# Patient Record
Sex: Female | Born: 1990 | Race: Black or African American | Hispanic: No | Marital: Single | Smoking: Light tobacco smoker
Health system: Southern US, Community
[De-identification: ages and names within clinical notes are randomized; demographics above are authoritative.]

## PROBLEM LIST (undated history)

## (undated) DIAGNOSIS — Z8639 Personal history of other endocrine, nutritional and metabolic disease: Secondary | ICD-10-CM

## (undated) DIAGNOSIS — E538 Deficiency of other specified B group vitamins: Secondary | ICD-10-CM

## (undated) DIAGNOSIS — H0520 Unspecified exophthalmos: Secondary | ICD-10-CM

## (undated) DIAGNOSIS — I10 Essential (primary) hypertension: Secondary | ICD-10-CM

## (undated) DIAGNOSIS — E049 Nontoxic goiter, unspecified: Secondary | ICD-10-CM

## (undated) DIAGNOSIS — E0500 Thyrotoxicosis with diffuse goiter without thyrotoxic crisis or storm: Secondary | ICD-10-CM

## (undated) DIAGNOSIS — E0590 Thyrotoxicosis, unspecified without thyrotoxic crisis or storm: Secondary | ICD-10-CM

## (undated) DIAGNOSIS — R569 Unspecified convulsions: Secondary | ICD-10-CM

## (undated) DIAGNOSIS — R7989 Other specified abnormal findings of blood chemistry: Secondary | ICD-10-CM

## (undated) HISTORY — DX: Other specified abnormal findings of blood chemistry: R79.89

## (undated) HISTORY — PX: THYROIDECTOMY: SHX17

## (undated) HISTORY — DX: Deficiency of other specified B group vitamins: E53.8

---

## 2014-11-05 DIAGNOSIS — E0590 Thyrotoxicosis, unspecified without thyrotoxic crisis or storm: Secondary | ICD-10-CM

## 2014-11-05 HISTORY — DX: Thyrotoxicosis, unspecified without thyrotoxic crisis or storm: E05.90

## 2015-06-24 ENCOUNTER — Encounter (HOSPITAL_BASED_OUTPATIENT_CLINIC_OR_DEPARTMENT_OTHER): Payer: Self-pay | Admitting: *Deleted

## 2015-06-24 ENCOUNTER — Emergency Department (HOSPITAL_BASED_OUTPATIENT_CLINIC_OR_DEPARTMENT_OTHER)
Admission: EM | Admit: 2015-06-24 | Discharge: 2015-06-24 | Disposition: A | Discharge status: Home / Self Care | Payer: Self-pay | Attending: Emergency Medicine | Admitting: Emergency Medicine

## 2015-06-24 ENCOUNTER — Other Ambulatory Visit: Payer: Self-pay

## 2015-06-24 DIAGNOSIS — E0590 Thyrotoxicosis, unspecified without thyrotoxic crisis or storm: Secondary | ICD-10-CM | POA: Insufficient documentation

## 2015-06-24 DIAGNOSIS — R002 Palpitations: Secondary | ICD-10-CM | POA: Insufficient documentation

## 2015-06-24 DIAGNOSIS — Z720 Tobacco use: Secondary | ICD-10-CM | POA: Insufficient documentation

## 2015-06-24 DIAGNOSIS — R197 Diarrhea, unspecified: Secondary | ICD-10-CM | POA: Insufficient documentation

## 2015-06-24 DIAGNOSIS — R000 Tachycardia, unspecified: Secondary | ICD-10-CM | POA: Insufficient documentation

## 2015-06-24 DIAGNOSIS — Z3202 Encounter for pregnancy test, result negative: Secondary | ICD-10-CM | POA: Insufficient documentation

## 2015-06-24 LAB — CBC WITH DIFFERENTIAL/PLATELET
BASOS PCT: 0 % (ref 0–1)
Basophils Absolute: 0 10*3/uL (ref 0.0–0.1)
EOS ABS: 0 10*3/uL (ref 0.0–0.7)
Eosinophils Relative: 0 % (ref 0–5)
HEMATOCRIT: 33.4 % — AB (ref 36.0–46.0)
HEMOGLOBIN: 11.4 g/dL — AB (ref 12.0–15.0)
Lymphocytes Relative: 39 % (ref 12–46)
Lymphs Abs: 1.8 10*3/uL (ref 0.7–4.0)
MCH: 28.1 pg (ref 26.0–34.0)
MCHC: 34.1 g/dL (ref 30.0–36.0)
MCV: 82.3 fL (ref 78.0–100.0)
Monocytes Absolute: 0.5 10*3/uL (ref 0.1–1.0)
Monocytes Relative: 11 % (ref 3–12)
NEUTROS ABS: 2.3 10*3/uL (ref 1.7–7.7)
NEUTROS PCT: 50 % (ref 43–77)
Platelets: 138 10*3/uL — ABNORMAL LOW (ref 150–400)
RBC: 4.06 MIL/uL (ref 3.87–5.11)
RDW: 13.5 % (ref 11.5–15.5)
WBC: 4.6 10*3/uL (ref 4.0–10.5)

## 2015-06-24 LAB — BASIC METABOLIC PANEL
ANION GAP: 7 (ref 5–15)
BUN: 10 mg/dL (ref 6–20)
CALCIUM: 9.1 mg/dL (ref 8.9–10.3)
CHLORIDE: 106 mmol/L (ref 101–111)
CO2: 24 mmol/L (ref 22–32)
CREATININE: 0.37 mg/dL — AB (ref 0.44–1.00)
GFR calc non Af Amer: >60 mL/min (ref >60)
Glucose, Bld: 164 mg/dL — ABNORMAL HIGH (ref 65–99)
Potassium: 3.3 mmol/L — ABNORMAL LOW (ref 3.5–5.1)
SODIUM: 137 mmol/L (ref 135–145)

## 2015-06-24 LAB — TSH: TSH: 0.018 u[IU]/mL — ABNORMAL LOW (ref 0.350–4.500)

## 2015-06-24 LAB — HCG, SERUM, QUALITATIVE: PREG SERUM: NEGATIVE

## 2015-06-24 MED ORDER — METHIMAZOLE 10 MG PO TABS: 10.0000 mg | ORAL_TABLET | Freq: Every day | ORAL | Status: DC

## 2015-06-24 MED ORDER — METOPROLOL TARTRATE 50 MG PO TABS
25.0000 mg | ORAL_TABLET | Freq: Once | ORAL | Status: AC
  Administered 2015-06-24: 25 mg via ORAL
  Filled 2015-06-24: qty 1

## 2015-06-24 MED ORDER — SODIUM CHLORIDE 0.9 % IV BOLUS (SEPSIS)
1000.0000 mL | Freq: Once | INTRAVENOUS | Status: AC
  Administered 2015-06-24: 1000 mL via INTRAVENOUS

## 2015-06-24 MED ORDER — METOPROLOL TARTRATE 1 MG/ML IV SOLN
5.0000 mg | INTRAVENOUS | Status: DC | PRN
  Administered 2015-06-24: 5 mg via INTRAVENOUS
  Filled 2015-06-24: qty 5

## 2015-06-24 MED ORDER — METHIMAZOLE 10 MG PO TABS
10.0000 mg | ORAL_TABLET | Freq: Once | ORAL | Status: DC
  Filled 2015-06-24: qty 1

## 2015-06-24 MED ORDER — PROPRANOLOL HCL 10 MG PO TABS: 10.0000 mg | ORAL_TABLET | Freq: Three times a day (TID) | ORAL | Status: DC

## 2015-06-24 NOTE — ED Notes (Signed)
Handoff to [place], Charity fundraiser

## 2015-06-24 NOTE — Discharge Instructions (Signed)
You will receive a call from the care manager at [facility] hospital to help you with your follow-up appointment at community clinic at [facility]. Begin your medications today. No aspirin, or aspirin products.   Hyperthyroidism The thyroid is a large gland located in the lower front part of your neck. The thyroid helps control metabolism. Metabolism is how your body uses food. It controls metabolism with the hormone thyroxine. When the thyroid is overactive, it produces too much hormone. When this happens, these following problems may occur:   Nervousness  Heat intolerance  Weight loss (in spite of increase food intake)  Diarrhea  Change in hair or skin texture  Palpitations (heart skipping or having extra beats)  Tachycardia (rapid heart rate)  Loss of menstruation (amenorrhea)  Shaking of the hands CAUSES  Grave's Disease (the immune system attacks the thyroid gland). This is the most common cause.  Inflammation of the thyroid gland.  Tumor (usually benign) in the thyroid gland or elsewhere.  Excessive use of thyroid medications (both prescription and 'natural').  Excessive ingestion of Iodine. DIAGNOSIS  To prove hyperthyroidism, your caregiver may do blood tests and ultrasound tests. Sometimes the signs are hidden. It may be necessary for your caregiver to watch this illness with blood tests, either before or after diagnosis and treatment. TREATMENT Short-term treatment There are several treatments to control symptoms. Drugs called beta blockers may give some relief. Drugs that decrease hormone production will provide temporary relief in many people. These measures will usually not give permanent relief. Definitive therapy There are treatments available which can be discussed between you and your caregiver which will permanently treat the problem. These treatments range from surgery (removal of the thyroid), to the use of radioactive iodine (destroys the thyroid by  radiation), to the use of antithyroid drugs (interfere with hormone synthesis). The first two treatments are permanent and usually successful. They most often require hormone replacement therapy for life. This is because it is impossible to remove or destroy the exact amount of thyroid required to make a person euthyroid (normal). HOME CARE INSTRUCTIONS  See your caregiver if the problems you are being treated for get worse. Examples of this would be the problems listed above. SEEK MEDICAL CARE IF: Your general condition worsens. MAKE SURE YOU:   Understand these instructions.  Will watch your condition.  Will get help right away if you are not doing well or get worse. Document Released: 10/22/2005 Document Revised: 01/14/2012 Document Reviewed: 03/05/2007 [facility] Patient Information 2015 [place], [place]. This information is not intended to replace advice given to you by your health care provider. Make sure you discuss any questions you have with your health care provider.

## 2015-06-24 NOTE — ED Notes (Signed)
Patient give water per her requesting and Dr. Fayrene Fearing approval.

## 2015-06-24 NOTE — Discharge Planning (Signed)
NCM consulted to assist with getting uninsured pt an appointment.  NCM called [facility] to find there we no appointments at this time and was transferred to Clinic Manager, Tarry Kos to request opening a slot for this pt.  VM left.  NCM contacted [facility] [place], Nurse Liason to try to get follow-up appointment or appointment at [facility] also with no luck.   NCM will continue to look for follow-up care for this pt.

## 2015-06-24 NOTE — ED Notes (Signed)
Pt reports that the health dept told her that she had thyroid problems.  Pt wants to be rx a medication to help with it.  Reports weight loss of >50lbs in 2 months.  Pt appears healthy.

## 2015-06-24 NOTE — ED Provider Notes (Addendum)
CSN: [id number]     Arrival date & time 06/24/15  1016 History   First MD Initiated Contact with Patient 06/24/15 1026     Chief Complaint  Patient presents with  . Thyroid Problem      HPI  Patient resists evaluation of weight loss, neck swelling, and concern about "my thyroid".   Patient states that she was seen and evaluated at the health department. Told states she told them that she had been losing some weight , and that her neck seemed  swollen. She was told that she "may have an overactive thyroid".  As was a month ago. In the interval she has continued to have palpitations. Tremor. Daily diarrhea. 30-50 pound weight loss. Her mother has noted that her eyes seemed bulge in her neck seems "swollen".   History reviewed. No pertinent past surgical history. History reviewed. No pertinent family history. Social History  Substance Use Topics  . Smoking status: Current Every Day Smoker -- 1.00 packs/day    Types: Cigarettes  . Smokeless tobacco: None  . Alcohol Use: No   OB History    No data available     Review of Systems  Constitutional: Positive for unexpected weight change. Negative for fever, chills, diaphoresis, appetite change and fatigue.  HENT: Negative for mouth sores, sore throat and trouble swallowing.        Neck "Swelling"  Eyes: Negative for visual disturbance.  Respiratory: Negative for cough, chest tightness, shortness of breath and wheezing.   Cardiovascular: Positive for palpitations. Negative for chest pain.  Gastrointestinal: Positive for diarrhea. Negative for nausea, vomiting, abdominal pain and abdominal distention.  Endocrine: Negative for polydipsia, polyphagia and polyuria.  Genitourinary: Negative for dysuria, frequency and hematuria.  Musculoskeletal: Negative for gait problem.  Skin: Negative for color change, pallor and rash.  Neurological: Negative for dizziness, syncope, light-headedness and headaches.  Hematological: Does not bruise/bleed  easily.  Psychiatric/Behavioral: Negative for behavioral problems and confusion.      Allergies  Review of patient's allergies indicates no known allergies.  Home Medications   Prior to Admission medications   Medication Sig Start Date End Date Taking? Authorizing Provider  methimazole (TAPAZOLE) 10 MG tablet Take 1 tablet (10 mg total) by mouth daily. 06/24/15   Rolland Porter, MD  propranolol (INDERAL) 10 MG tablet Take 1 tablet (10 mg total) by mouth 3 (three) times daily. 06/24/15   Rolland Porter, MD   BP 149/81 mmHg  Pulse 101  Temp(Src) 98.8 F (37.1 C) (Oral)  Resp 18  Ht 5\' 5"  (1.651 m)  Wt 165 lb (74.844 kg)  BMI 27.46 kg/m2  SpO2 99%  LMP 06/23/2015 Physical Exam  Constitutional: She is oriented to person, place, and time. She appears well-developed and well-nourished. No distress.  HENT:  Head: Normocephalic.  Eyes: Conjunctivae are normal. Pupils are equal, round, and reactive to light. No scleral icterus.  Neck: Normal range of motion. Neck supple. Thyromegaly present.    Cardiovascular: Regular rhythm.  Tachycardia present.  Exam reveals no gallop and no friction rub.   No murmur heard. Resting sinus tachycardia rate 1:30.  Pulmonary/Chest: Effort normal and breath sounds normal. No respiratory distress. She has no wheezes. She has no rales.  Abdominal: Soft. Bowel sounds are normal. She exhibits no distension. There is no tenderness. There is no rebound.  Musculoskeletal: Normal range of motion.  Neurological: She is alert and oriented to person, place, and time.  Skin: Skin is warm and dry. No rash noted.  Psychiatric: She has a normal mood and affect. Her behavior is normal.    ED Course  Procedures (including critical care time) Labs Review Labs Reviewed  TSH - Abnormal; Notable for the following:    TSH 0.018 (*)    All other components within normal limits  BASIC METABOLIC PANEL - Abnormal; Notable for the following:    Potassium 3.3 (*)    Glucose,  Bld 164 (*)    Creatinine, Ser 0.37 (*)    All other components within normal limits  CBC WITH DIFFERENTIAL/PLATELET - Abnormal; Notable for the following:    Hemoglobin 11.4 (*)    HCT 33.4 (*)    Platelets 138 (*)    All other components within normal limits  HCG, SERUM, QUALITATIVE  T4  T3    Imaging Review No results found. I have personally reviewed and evaluated these images and lab results as part of my medical decision-making.   EKG Interpretation None      MDM   Final diagnoses:  Hyperthyroidism   Classic symptoms and presentation for thyrotoxicosis. Is not hypotensive or confused or febrile. Not clinically in thyroid storm. Was given beta blockers with IV metoprolol and fluids. Heart rate improves to 99.  TSH sent to [facility] laboratory and is expectantly low at 0.018. I placed a call to to endocrinologist. We do not keep an endocrinologist on call. Both of these physicians were out of the office on this Friday afternoon and unavailable.  I spoke with our care manager. Patient is uninsured and does not have a current primary care physician. Arrangements made for her to go on the [facility] card system to [facility] on Monday. She will call for an appointment she has not heard back from the care manager regarding a specific time.  I spoke with our pharmacist here at [facility] and we discussed the most inexpensive beta blockers and dosing and cost for methimazole. Given a prescription for Inderal and methimazole. I think this is a very safe initiation of treatment for her. I feel this is necessary to call to her progression of symptoms. We discussed that she will thoroughly need ultrasound and ultimate definitive treatment including possible I-131 treatment versus surgical treatment. She Jolyn Nap understanding this. This can be addressed further follow-up appointment.  CRITICAL CARE Performed by: Rolland Porter JOSEPH   Total critical care time: 60  Minutes.  IV fluid bolus, IV beta blockers for heart rate control. Multiple consultations. Multiple re-evaluations and discussions with patient and family.  Critical care time was exclusive of separately billable procedures and treating other patients.  Critical care was necessary to treat or prevent imminent or life-threatening deterioration.  Critical care was time spent personally by me on the following activities: development of treatment plan with patient and/or surrogate as well as nursing, discussions with consultants, evaluation of patient's response to treatment, examination of patient, obtaining history from patient or surrogate, ordering and performing treatments and interventions, ordering and review of laboratory studies, ordering and review of radiographic studies, pulse oximetry and re-evaluation of patient's condition. care    Rolland Porter, MD 06/24/15 1631  Rolland Porter, MD 06/24/15 [phone]

## 2015-06-27 LAB — T3

## 2015-07-01 LAB — T4

## 2015-09-08 ENCOUNTER — Emergency Department (HOSPITAL_BASED_OUTPATIENT_CLINIC_OR_DEPARTMENT_OTHER): Payer: Self-pay

## 2015-09-08 ENCOUNTER — Encounter (HOSPITAL_BASED_OUTPATIENT_CLINIC_OR_DEPARTMENT_OTHER): Payer: Self-pay | Admitting: Emergency Medicine

## 2015-09-08 ENCOUNTER — Emergency Department (HOSPITAL_BASED_OUTPATIENT_CLINIC_OR_DEPARTMENT_OTHER)
Admission: EM | Admit: 2015-09-08 | Discharge: 2015-09-08 | Disposition: A | Discharge status: Home / Self Care | Payer: Self-pay | Attending: Emergency Medicine | Admitting: Emergency Medicine

## 2015-09-08 DIAGNOSIS — Z79899 Other long term (current) drug therapy: Secondary | ICD-10-CM | POA: Insufficient documentation

## 2015-09-08 DIAGNOSIS — Z720 Tobacco use: Secondary | ICD-10-CM | POA: Insufficient documentation

## 2015-09-08 DIAGNOSIS — E010 Iodine-deficiency related diffuse (endemic) goiter: Secondary | ICD-10-CM | POA: Insufficient documentation

## 2015-09-08 DIAGNOSIS — J029 Acute pharyngitis, unspecified: Secondary | ICD-10-CM | POA: Insufficient documentation

## 2015-09-08 DIAGNOSIS — E0590 Thyrotoxicosis, unspecified without thyrotoxic crisis or storm: Secondary | ICD-10-CM | POA: Insufficient documentation

## 2015-09-08 HISTORY — DX: Thyrotoxicosis, unspecified without thyrotoxic crisis or storm: E05.90

## 2015-09-08 LAB — HCG, SERUM, QUALITATIVE: Preg, Serum: NEGATIVE

## 2015-09-08 LAB — BASIC METABOLIC PANEL
ANION GAP: 8 (ref 5–15)
BUN: 11 mg/dL (ref 6–20)
CO2: 23 mmol/L (ref 22–32)
Calcium: 8.8 mg/dL — ABNORMAL LOW (ref 8.9–10.3)
Chloride: 107 mmol/L (ref 101–111)
Creatinine, Ser: 0.3 mg/dL — ABNORMAL LOW (ref 0.44–1.00)
GFR calc Af Amer: >60 mL/min (ref >60)
GFR calc non Af Amer: >60 mL/min (ref >60)
GLUCOSE: 127 mg/dL — AB (ref 65–99)
POTASSIUM: 4 mmol/L (ref 3.5–5.1)
Sodium: 138 mmol/L (ref 135–145)

## 2015-09-08 LAB — CBC WITH DIFFERENTIAL/PLATELET
Basophils Absolute: 0 10*3/uL (ref 0.0–0.1)
Basophils Relative: 0 %
Eosinophils Absolute: 0.1 10*3/uL (ref 0.0–0.7)
Eosinophils Relative: 3 %
HEMATOCRIT: 33.4 % — AB (ref 36.0–46.0)
Hemoglobin: 11.3 g/dL — ABNORMAL LOW (ref 12.0–15.0)
LYMPHS ABS: 2.7 10*3/uL (ref 0.7–4.0)
LYMPHS PCT: 60 %
MCH: 27.2 pg (ref 26.0–34.0)
MCHC: 33.8 g/dL (ref 30.0–36.0)
MCV: 80.3 fL (ref 78.0–100.0)
MONO ABS: 0.4 10*3/uL (ref 0.1–1.0)
MONOS PCT: 8 %
NEUTROS ABS: 1.3 10*3/uL — AB (ref 1.7–7.7)
Neutrophils Relative %: 29 %
Platelets: 149 10*3/uL — ABNORMAL LOW (ref 150–400)
RBC: 4.16 MIL/uL (ref 3.87–5.11)
RDW: 14.2 % (ref 11.5–15.5)
WBC: 4.6 10*3/uL (ref 4.0–10.5)

## 2015-09-08 MED ORDER — IOHEXOL 300 MG/ML  SOLN
75.0000 mL | Freq: Once | INTRAMUSCULAR | Status: AC | PRN
  Administered 2015-09-08: 75 mL via INTRAVENOUS

## 2015-09-08 MED ORDER — IPRATROPIUM-ALBUTEROL 0.5-2.5 (3) MG/3ML IN SOLN
3.0000 mL | Freq: Once | RESPIRATORY_TRACT | Status: AC
  Administered 2015-09-08: 3 mL via RESPIRATORY_TRACT
  Filled 2015-09-08: qty 3

## 2015-09-08 MED ORDER — METHIMAZOLE 10 MG PO TABS: 10.0000 mg | ORAL_TABLET | Freq: Three times a day (TID) | ORAL | Status: DC

## 2015-09-08 MED ORDER — PROPRANOLOL HCL 10 MG PO TABS: 10.0000 mg | ORAL_TABLET | Freq: Three times a day (TID) | ORAL | Status: DC

## 2015-09-08 NOTE — Discharge Instructions (Signed)
Methimazole and Inderal as prescribed.  Follow up with an endocrinologist.  The contact information has been provided in this discharge summary for you to call and arranged this appointment.   Hyperthyroidism Hyperthyroidism is when the thyroid is too active (overactive). Your thyroid is a large gland that is located in your neck. The thyroid helps to control how your body uses food (metabolism). When your thyroid is overactive, it produces too much of a hormone called thyroxine.  CAUSES Causes of hyperthyroidism may include:  Graves disease. This is when your immune system attacks the thyroid gland. This is the most common cause.  Inflammation of the thyroid gland.  Tumor in the thyroid gland or somewhere else.  Excessive use of thyroid medicines, including:  Prescription thyroid supplement.  Herbal supplements that mimic thyroid hormones.  Solid or fluid-filled lumps within your thyroid gland (thyroid nodules).  Excessive ingestion of iodine. RISK FACTORS  Being female.  Having a family history of thyroid conditions. SIGNS AND SYMPTOMS Signs and symptoms of hyperthyroidism may include:  Nervousness.  Inability to tolerate heat.  Unexplained weight loss.  Diarrhea.  Change in the texture of hair or skin.  Heart skipping beats or making extra beats.  Rapid heart rate.  Loss of menstruation.  Shaky hands.  Fatigue.  Restlessness.  Increased appetite.  Sleep problems.  Enlarged thyroid gland or nodules. DIAGNOSIS  Diagnosis of hyperthyroidism may include:  Medical history and physical exam.  Blood tests.  Ultrasound tests. TREATMENT Treatment may include:  Medicines to control your thyroid.  Surgery to remove your thyroid.  Radiation therapy. HOME CARE INSTRUCTIONS   Take medicines only as directed by your health care provider.  Do not use any tobacco products, including cigarettes, chewing tobacco, or electronic cigarettes. If you need  help quitting, ask your health care provider.  Do not exercise or do physical activity until your health care provider approves.  Keep all follow-up appointments as directed by your health care provider. This is important. SEEK MEDICAL CARE IF:  Your symptoms do not get better with treatment.  You have fever.  You are taking thyroid replacement medicine and you:  Have depression.  Feel mentally and physically slow.  Have weight gain. SEEK IMMEDIATE MEDICAL CARE IF:   You have decreased alertness or a change in your awareness.  You have abdominal pain.  You feel dizzy.  You have a rapid heartbeat.  You have an irregular heartbeat.   This information is not intended to replace advice given to you by your health care provider. Make sure you discuss any questions you have with your health care provider.   Document Released: 10/22/2005 Document Revised: 11/12/2014 Document Reviewed: 03/09/2014 Elsevier Interactive Patient Education [employer]2016 Elsevier Inc.

## 2015-09-08 NOTE — ED Notes (Signed)
Pt c/o sob x 5 days w throat and neck soreness   Denies cough or fever

## 2015-09-08 NOTE — ED Notes (Signed)
Reports SOB for about a week. Speaking in short sentences. State she was recently dx with hyperthyroid disease.

## 2015-09-08 NOTE — ED Provider Notes (Signed)
CSN: [id number]     Arrival date & time 09/08/15  2050 History   By signing my name below, I, Gonzella Lex, attest that this documentation has been prepared under the direction and in the presence of Geoffery Lyons, MD. Electronically Signed: Gonzella Lex, Scribe. 09/08/2015. 9:40 PM.    Chief Complaint  Patient presents with  . Shortness of Breath   The history is provided by the patient. No language interpreter was used.    HPI Comments: Sheri Hodge is a 24 y.o. female who presents to the Emergency Department complaining of constant throat pain and trouble with swallowing onset 5 days ago. She describes pain as inside and outside of her throat and a sensation of "things getting stuck" when swallowing. Pt states SOB as an associated symptom. She had a breathing treatment in the ED with no relief to her pain. Pt reports a history of similar symptoms which occurred 2 months ago. She was evaluated in the ED on 8/19 for these symptoms, was diagnosed with hyperthyroidism and was prescribed Inderal and Tapazole. Pt denies cough, fever, and hx of asthma. Pt reports being healthy otherwise.   Past Medical History  Diagnosis Date  . Hyperthyroidism 2016   History reviewed. No pertinent past surgical history. No family history on file. Social History  Substance Use Topics  . Smoking status: Light Tobacco Smoker -- 0.25 packs/day    Types: Cigarettes  . Smokeless tobacco: None  . Alcohol Use: No   OB History    No data available     Review of Systems  Constitutional: Negative for fever.  HENT: Positive for sore throat and trouble swallowing.   Respiratory: Positive for shortness of breath. Negative for cough.   All other systems reviewed and are negative.  Allergies  Review of patient's allergies indicates no known allergies.  Home Medications   Prior to Admission medications   Medication Sig Start Date End Date Taking? Authorizing Provider  methimazole (TAPAZOLE) 10  MG tablet Take 1 tablet (10 mg total) by mouth daily. 06/24/15   Rolland Porter, MD  propranolol (INDERAL) 10 MG tablet Take 1 tablet (10 mg total) by mouth 3 (three) times daily. 06/24/15   Rolland Porter, MD   BP 149/83 mmHg  Pulse 108  Temp(Src) 97.9 F (36.6 C) (Oral)  Resp 20  Ht  (1.651 m)  Wt 155 lb 8 oz (70.534 kg)  BMI 25.88 kg/m2  SpO2 100%  LMP 09/05/2015 (Exact Date) Physical Exam  Constitutional: She is oriented to person, place, and time. She appears well-developed and well-nourished. No distress.  HENT:  Head: Normocephalic.  Mouth/Throat: Oropharynx is clear and moist. No oropharyngeal exudate.  Eyes: Conjunctivae are normal.  Neck: Normal range of motion. Neck supple.  Cardiovascular: Normal rate.   Pulmonary/Chest: Effort normal and breath sounds normal. No stridor. No respiratory distress. She has no wheezes. She has no rales.  Abdominal: She exhibits no distension.  Lymphadenopathy:    She has no cervical adenopathy.  Neurological: She is alert and oriented to person, place, and time.  Skin: Skin is warm and dry.  Psychiatric: She has a normal mood and affect.  Nursing note and vitals reviewed.   ED Course  Procedures  DIAGNOSTIC STUDIES:    Oxygen Saturation is 100% on RA, normal by my interpretation.   COORDINATION OF CARE: 9:53 PM Will order CT scan of neck. Discussed treatment plan with pt at bedside and pt agreed to plan.  Labs Review Labs Reviewed - No data to display  Imaging Review No results found. I have personally reviewed and evaluated these images and lab results as part of my medical decision-making.   EKG Interpretation None      MDM   Final diagnoses:  None    Patient with swelling and fullness in neck.  She was diagnosed with hyperthyroidism two months ago and was prescribed methimazole and inderal which has since run out of.  She tells me her mother is in the process of arranging a followup appointment with and  endocrinologist.    Her ct today reveals a massively enlarged thyroid gland but no evidence for airway impingement.  I have discussed this with Dr. Ezzard StandingNewman from ENT who feels as though the patient requires initial medical management from an endocrinologist.  We do not have an endocrine doctor on call, but will re-prescribe the methimazole and inderal and give her the followup information for endocrinology.  I personally performed the services described in this documentation, which was scribed in my presence. The recorded information has been reviewed and is accurate.         Geoffery Lyonsouglas Ulmer Degen, MD 09/08/15 2238

## 2015-12-02 DIAGNOSIS — R634 Abnormal weight loss: Secondary | ICD-10-CM | POA: Insufficient documentation

## 2015-12-02 DIAGNOSIS — R000 Tachycardia, unspecified: Secondary | ICD-10-CM

## 2015-12-02 HISTORY — DX: Tachycardia, unspecified: R00.0

## 2015-12-02 HISTORY — DX: Abnormal weight loss: R63.4

## 2015-12-29 DIAGNOSIS — Z79899 Other long term (current) drug therapy: Secondary | ICD-10-CM

## 2015-12-29 HISTORY — DX: Other long term (current) drug therapy: Z79.899

## 2016-02-24 ENCOUNTER — Encounter (HOSPITAL_BASED_OUTPATIENT_CLINIC_OR_DEPARTMENT_OTHER): Payer: Self-pay | Admitting: Emergency Medicine

## 2016-02-24 ENCOUNTER — Emergency Department (HOSPITAL_BASED_OUTPATIENT_CLINIC_OR_DEPARTMENT_OTHER)
Admission: EM | Admit: 2016-02-24 | Discharge: 2016-02-24 | Disposition: A | Discharge status: Home / Self Care | Payer: Self-pay | Attending: Emergency Medicine | Admitting: Emergency Medicine

## 2016-02-24 ENCOUNTER — Emergency Department (HOSPITAL_BASED_OUTPATIENT_CLINIC_OR_DEPARTMENT_OTHER): Payer: Self-pay

## 2016-02-24 DIAGNOSIS — R609 Edema, unspecified: Secondary | ICD-10-CM

## 2016-02-24 DIAGNOSIS — M1310 Monoarthritis, not elsewhere classified, unspecified site: Secondary | ICD-10-CM | POA: Insufficient documentation

## 2016-02-24 DIAGNOSIS — M10022 Idiopathic gout, left elbow: Secondary | ICD-10-CM | POA: Insufficient documentation

## 2016-02-24 DIAGNOSIS — M25522 Pain in left elbow: Secondary | ICD-10-CM

## 2016-02-24 DIAGNOSIS — M7989 Other specified soft tissue disorders: Secondary | ICD-10-CM | POA: Insufficient documentation

## 2016-02-24 DIAGNOSIS — Z8639 Personal history of other endocrine, nutritional and metabolic disease: Secondary | ICD-10-CM | POA: Insufficient documentation

## 2016-02-24 DIAGNOSIS — R079 Chest pain, unspecified: Secondary | ICD-10-CM | POA: Insufficient documentation

## 2016-02-24 DIAGNOSIS — F17210 Nicotine dependence, cigarettes, uncomplicated: Secondary | ICD-10-CM | POA: Insufficient documentation

## 2016-02-24 DIAGNOSIS — M109 Gout, unspecified: Secondary | ICD-10-CM

## 2016-02-24 DIAGNOSIS — Z79899 Other long term (current) drug therapy: Secondary | ICD-10-CM | POA: Insufficient documentation

## 2016-02-24 DIAGNOSIS — M79602 Pain in left arm: Secondary | ICD-10-CM | POA: Insufficient documentation

## 2016-02-24 LAB — CBC WITH DIFFERENTIAL/PLATELET
BASOS PCT: 0 %
Basophils Absolute: 0 10*3/uL (ref 0.0–0.1)
EOS ABS: 0.1 10*3/uL (ref 0.0–0.7)
EOS PCT: 2 %
HCT: 35.6 % — ABNORMAL LOW (ref 36.0–46.0)
HEMOGLOBIN: 12.2 g/dL (ref 12.0–15.0)
Lymphocytes Relative: 52 %
Lymphs Abs: 3.4 10*3/uL (ref 0.7–4.0)
MCH: 27 pg (ref 26.0–34.0)
MCHC: 34.3 g/dL (ref 30.0–36.0)
MCV: 78.8 fL (ref 78.0–100.0)
Monocytes Absolute: 0.6 10*3/uL (ref 0.1–1.0)
Monocytes Relative: 9 %
NEUTROS PCT: 38 %
Neutro Abs: 2.5 10*3/uL (ref 1.7–7.7)
PLATELETS: 188 10*3/uL (ref 150–400)
RBC: 4.52 MIL/uL (ref 3.87–5.11)
RDW: 13.7 % (ref 11.5–15.5)
WBC: 6.6 10*3/uL (ref 4.0–10.5)

## 2016-02-24 LAB — SEDIMENTATION RATE: SED RATE: 36 mm/h — AB (ref 0–22)

## 2016-02-24 LAB — URIC ACID: URIC ACID, SERUM: 5.6 mg/dL (ref 2.3–6.6)

## 2016-02-24 MED ORDER — HYDROCODONE-ACETAMINOPHEN 5-325 MG PO TABS: 2.0000 | ORAL_TABLET | ORAL | Status: DC | PRN

## 2016-02-24 MED ORDER — INDOMETHACIN 25 MG PO CAPS: 25.0000 mg | ORAL_CAPSULE | Freq: Two times a day (BID) | ORAL | Status: DC

## 2016-02-24 MED ORDER — COLCHICINE 0.6 MG PO TABS: 0.6000 mg | ORAL_TABLET | Freq: Every day | ORAL | Status: DC

## 2016-02-24 MED ORDER — PREDNISONE 50 MG PO TABS
60.0000 mg | ORAL_TABLET | Freq: Once | ORAL | Status: AC
  Administered 2016-02-24: 60 mg via ORAL
  Filled 2016-02-24: qty 1

## 2016-02-24 MED ORDER — COLCHICINE 0.6 MG PO TABS
0.6000 mg | ORAL_TABLET | Freq: Once | ORAL | Status: AC
  Administered 2016-02-24: 0.6 mg via ORAL
  Filled 2016-02-24: qty 1

## 2016-02-24 NOTE — Discharge Instructions (Signed)
Gout episodes will stay limited to the elbow.  It should not produce symptoms higher or lower in your arm.  If you develop fever, or other additional symptoms or change in appearance of the elbow please recheck.  Gout Gout is an inflammatory arthritis caused by a buildup of uric acid crystals in the joints. Uric acid is a chemical that is normally present in the blood. When the level of uric acid in the blood is too high it can form crystals that deposit in your joints and tissues. This causes joint redness, soreness, and swelling (inflammation). Repeat attacks are common. Over time, uric acid crystals can form into masses (tophi) near a joint, destroying bone and causing disfigurement. Gout is treatable and often preventable. CAUSES  The disease begins with elevated levels of uric acid in the blood. Uric acid is produced by your body when it breaks down a naturally found substance called purines. Certain foods you eat, such as meats and fish, contain high amounts of purines. Causes of an elevated uric acid level include:  Being passed down from parent to child (heredity).  Diseases that cause increased uric acid production (such as obesity, psoriasis, and certain cancers).  Excessive alcohol use.  Diet, especially diets rich in meat and seafood.  Medicines, including certain cancer-fighting medicines (chemotherapy), water pills (diuretics), and aspirin.  Chronic kidney disease. The kidneys are no longer able to remove uric acid well.  Problems with metabolism. Conditions strongly associated with gout include:  Obesity.  High blood pressure.  High cholesterol.  Diabetes. Not everyone with elevated uric acid levels gets gout. It is not understood why some people get gout and others do not. Surgery, joint injury, and eating too much of certain foods are some of the factors that can lead to gout attacks. SYMPTOMS   An attack of gout comes on quickly. It causes intense pain with  redness, swelling, and warmth in a joint.  Fever can occur.  Often, only one joint is involved. Certain joints are more commonly involved:  Base of the big toe.  Knee.  Ankle.  Wrist.  Finger. Without treatment, an attack usually goes away in a few days to weeks. Between attacks, you usually will not have symptoms, which is different from many other forms of arthritis. DIAGNOSIS  Your caregiver will suspect gout based on your symptoms and exam. In some cases, tests may be recommended. The tests may include:  Blood tests.  Urine tests.  X-rays.  Joint fluid exam. This exam requires a needle to remove fluid from the joint (arthrocentesis). Using a microscope, gout is confirmed when uric acid crystals are seen in the joint fluid. TREATMENT  There are two phases to gout treatment: treating the sudden onset (acute) attack and preventing attacks (prophylaxis).  Treatment of an Acute Attack.  Medicines are used. These include anti-inflammatory medicines or steroid medicines.  An injection of steroid medicine into the affected joint is sometimes necessary.  The painful joint is rested. Movement can worsen the arthritis.  You may use warm or cold treatments on painful joints, depending which works best for you.  Treatment to Prevent Attacks.  If you suffer from frequent gout attacks, your caregiver may advise preventive medicine. These medicines are started after the acute attack subsides. These medicines either help your kidneys eliminate uric acid from your body or decrease your uric acid production. You may need to stay on these medicines for a very long time.  The early phase of treatment with preventive  medicine can be associated with an increase in acute gout attacks. For this reason, during the first few months of treatment, your caregiver may also advise you to take medicines usually used for acute gout treatment. Be sure you understand your caregiver's directions. Your  caregiver may make several adjustments to your medicine dose before these medicines are effective.  Discuss dietary treatment with your caregiver or dietitian. Alcohol and drinks high in sugar and fructose and foods such as meat, poultry, and seafood can increase uric acid levels. Your caregiver or dietitian can advise you on drinks and foods that should be limited. HOME CARE INSTRUCTIONS   Do not take aspirin to relieve pain. This raises uric acid levels.  Only take over-the-counter or prescription medicines for pain, discomfort, or fever as directed by your caregiver.  Rest the joint as much as possible. When in bed, keep sheets and blankets off painful areas.  Keep the affected joint raised (elevated).  Apply warm or cold treatments to painful joints. Use of warm or cold treatments depends on which works best for you.  Use crutches if the painful joint is in your leg.  Drink enough fluids to keep your urine clear or pale yellow. This helps your body get rid of uric acid. Limit alcohol, sugary drinks, and fructose drinks.  Follow your dietary instructions. Pay careful attention to the amount of protein you eat. Your daily diet should emphasize fruits, vegetables, whole grains, and fat-free or low-fat milk products. Discuss the use of coffee, vitamin C, and cherries with your caregiver or dietitian. These may be helpful in lowering uric acid levels.  Maintain a healthy body weight. SEEK MEDICAL CARE IF:   You develop diarrhea, vomiting, or any side effects from medicines.  You do not feel better in 24 hours, or you are getting worse. SEEK IMMEDIATE MEDICAL CARE IF:   Your joint becomes suddenly more tender, and you have chills or a fever. MAKE SURE YOU:   Understand these instructions.  Will watch your condition.  Will get help right away if you are not doing well or get worse.   This information is not intended to replace advice given to you by your health care provider. Make  sure you discuss any questions you have with your health care provider.   Document Released: 10/19/2000 Document Revised: 11/12/2014 Document Reviewed: 06/04/2012 Elsevier Interactive Patient Education [employer]2016 [employer]

## 2016-02-24 NOTE — ED Provider Notes (Signed)
CSN: [id number]     Arrival date & time 02/24/16  1833 History  By signing my name below, I, Budd Palmer, attest that this documentation has been prepared under the direction and in the presence of Rolland Porter, MD. Electronically Signed: Budd Palmer, ED Scribe. 02/24/2016. 7:24 PM.      Chief Complaint  Patient presents with  . Arm Pain   The history is provided by the patient. No language interpreter was used.   HPI Comments: Sheri Hodge is a 25 y.o. female smoker at 0.25 ppd with a PMHx of hyperthyroidism who presents to the Emergency Department complaining of constant left elbow pain of sudden onset 2 days ago. Pt states she is unable to fully straighten her arm due to pain in the elbow joint. She states "it feels tight" and as though "something is about to pop." She denies any recent injuries, as well as denying any pain in the muscles of the upper or lower arm. She has been wearing her nephew's arm sling for this with mild relief. She states her left axillary lymph nodes also feel swollen and tender. She reports she had an episode 2 weeks ago during which the whole right side of her body "went stiff" and she was unable to move, even with other people trying to move her joints for her. She states this lasted for about 30 minutes, before resolving completely. She notes 2 previous episodes of the same, each of which resolved on its own. She states she is being seen by an endocrinologist for her hyperthyroidism, for which she is on metoprolol. She reports a FHx of gout, but denies a personal history of the same. She denies any recent IV sticks to the arm for blood work or drugs. She notes she is currently on her period.   She also c/o occasional swelling of her bilateral feel with dark discoloration, about which she has also spoken with her endocrinologist. She also reports some chest tightness and pain under her left breast, but notes this may be due to her being on her period.  Past Medical  History  Diagnosis Date  . Hyperthyroidism 2016   History reviewed. No pertinent past surgical history. History reviewed. No pertinent family history. Social History  Substance Use Topics  . Smoking status: Light Tobacco Smoker -- 0.25 packs/day    Types: Cigarettes  . Smokeless tobacco: None  . Alcohol Use: No   OB History    No data available     Review of Systems  Constitutional: Negative for fever, chills, diaphoresis, appetite change and fatigue.  HENT: Negative for mouth sores, sore throat and trouble swallowing.   Eyes: Negative for visual disturbance.  Respiratory: Negative for cough, chest tightness, shortness of breath and wheezing.   Cardiovascular: Positive for chest pain and leg swelling (bilateral feet).  Gastrointestinal: Negative for nausea, vomiting, abdominal pain, diarrhea and abdominal distention.  Endocrine: Negative for polydipsia, polyphagia and polyuria.  Genitourinary: Negative for dysuria, frequency and hematuria.  Musculoskeletal: Positive for myalgias and arthralgias. Negative for gait problem.  Skin: Positive for color change. Negative for pallor and rash.  Neurological: Negative for dizziness, syncope, light-headedness and headaches.  Hematological: Does not bruise/bleed easily.  Psychiatric/Behavioral: Negative for behavioral problems and confusion.    Allergies  Review of patient's allergies indicates no known allergies.  Home Medications   Prior to Admission medications   Medication Sig Start Date End Date Taking? Authorizing Provider  colchicine 0.6 MG tablet Take 1 tablet (0.6 mg  total) by mouth daily. 02/24/16   Rolland Porter, MD  HYDROcodone-acetaminophen (NORCO/VICODIN) 5-325 MG tablet Take 2 tablets by mouth every 4 (four) hours as needed. 02/24/16   Rolland Porter, MD  indomethacin (INDOCIN) 25 MG capsule Take 1 capsule (25 mg total) by mouth 2 (two) times daily with a meal. 02/24/16   Rolland Porter, MD  methimazole (TAPAZOLE) 10 MG tablet Take 1  tablet (10 mg total) by mouth 3 (three) times daily. 09/08/15   Geoffery Lyons, MD  propranolol (INDERAL) 10 MG tablet Take 1 tablet (10 mg total) by mouth 3 (three) times daily. 09/08/15   Geoffery Lyons, MD   BP 145/86 mmHg  Pulse 110  Temp(Src) 98.6 F (37 C) (Oral)  Resp 16  Ht  (1.651 m)  Wt 155 lb (70.308 kg)  BMI 25.79 kg/m2  SpO2 100%  LMP 02/24/2016 Physical Exam  Constitutional: She is oriented to person, place, and time. She appears well-developed and well-nourished. No distress.  HENT:  Head: Normocephalic.  Eyes: Conjunctivae are normal. Pupils are equal, round, and reactive to light. No scleral icterus.  Neck: Normal range of motion. Neck supple. No thyromegaly present.  Cardiovascular: Normal rate and regular rhythm.  Exam reveals no gallop and no friction rub.   No murmur heard. Pulmonary/Chest: Effort normal and breath sounds normal. No respiratory distress. She has no wheezes. She has no rales.  Abdominal: Soft. Bowel sounds are normal. She exhibits no distension. There is no tenderness. There is no rebound.  Musculoskeletal: Normal range of motion. She exhibits edema and tenderness.  TTP at the anterior aspect of the left ACF, pain to actively or passively extend the elbow, no edema to the hand, normal profusion, pain is not exacerbated by active and passive motion of the wrist, some soft-tissue swelling in the left upper arm, about 2 cm greater than the right  Neurological: She is alert and oriented to person, place, and time.  Skin: Skin is warm and dry. No rash noted.  Dark pigmentation of the skin on both feet  Psychiatric: She has a normal mood and affect. Her behavior is normal.    ED Course  Procedures  DIAGNOSTIC STUDIES: Oxygen Saturation is 100% on RA, normal by my interpretation.    COORDINATION OF CARE: 7:00 PM - Discussed plans to order diagnostic imaging to r/o a blood clot. Pt advised of plan for treatment and pt agrees.  Labs Review Labs  Reviewed  CBC WITH DIFFERENTIAL/PLATELET - Abnormal; Notable for the following:    HCT 35.6 (*)    All other components within normal limits  SEDIMENTATION RATE - Abnormal; Notable for the following:    Sed Rate 36 (*)    All other components within normal limits  URIC ACID    Imaging Review US Venous Img Upper Uni Left  02/24/2016  CLINICAL DATA:  Acute onset LEFT elbow pain and swelling. Hyperthyroid patient. EXAM: LEFT UPPER EXTREMITY VENOUS DOPPLER ULTRASOUND TECHNIQUE: Gray-scale sonography with graded compression, as well as color Doppler and duplex ultrasound were performed to evaluate the upper extremity deep venous system from the level of the subclavian vein and including the jugular, axillary, basilic, radial, ulnar and upper cephalic vein. Spectral Doppler was utilized to evaluate flow at rest and with distal augmentation maneuvers. COMPARISON:  Neck CT 09/08/2015 FINDINGS: Contralateral Subclavian Vein: Respiratory phasicity is normal and symmetric with the symptomatic side. No evidence of thrombus. Normal compressibility. Internal Jugular Vein: No evidence of thrombus. Normal compressibility, respiratory phasicity and  response to augmentation. Subclavian Vein: No evidence of thrombus. Normal compressibility, respiratory phasicity and response to augmentation. Axillary Vein: No evidence of thrombus. Normal compressibility, respiratory phasicity and response to augmentation. Cephalic Vein: No evidence of thrombus. Normal compressibility, respiratory phasicity and response to augmentation. Basilic Vein: No evidence of thrombus. Normal compressibility, respiratory phasicity and response to augmentation. Brachial Veins: No evidence of thrombus. Normal compressibility, respiratory phasicity and response to augmentation. Radial Veins: No evidence of thrombus. Normal compressibility, respiratory phasicity and response to augmentation. Ulnar Veins: No evidence of thrombus. Normal compressibility,  respiratory phasicity and response to augmentation. Other Findings: Enlarged thyroid gland is again demonstrated measuring 9 cm x 4 cm x 4.6 cm. This corresponds to CT findings of 09/08/2015 with bilateral enlargement of the thyroid gland noted. IMPRESSION: 1. No evidence of deep venous thrombosis in the LEFT upper extremity. 2. Massively enlarged thyroid gland corresponds the CT 09/08/2015. Recommend endocrinology consultation. Electronically Signed   By: Genevive BiStewart  Edmunds M.D.   On: 02/24/2016 20:19   I have personally reviewed and evaluated these images and lab results as part of my medical decision-making.   EKG Interpretation None      MDM   Final diagnoses:  Swelling  Elbow pain, left  Acute gout of left elbow, unspecified cause   Impression is an acute atraumatic mono arthropathy. This is not tendinous or muscular. Is well localized to the joint. She has no fever or white count thus I doubt infection. She has a normal ultrasound, i.e. no blood clot. Plan will be prednisone, anti-inflammatories, colchicine, sling. I discussed with her at length that if this is gout of should say limited to the peri-particular area she develops any new or worsening symptoms have asked her to recheck here.  I personally performed the services described in this documentation, which was scribed in my presence. The recorded information has been reviewed and is accurate.   Rolland PorterMark Kenyen Candy, MD 02/24/16 2141

## 2016-02-24 NOTE — ED Notes (Signed)
Patient ambulated to ultrasound.

## 2016-02-24 NOTE — ED Notes (Signed)
The patient states that she had problems with her left arm x the last 2 days. In sling from her nephew. Patient also has some sinus congestion

## 2016-05-03 DIAGNOSIS — F445 Conversion disorder with seizures or convulsions: Secondary | ICD-10-CM

## 2016-05-03 DIAGNOSIS — F449 Dissociative and conversion disorder, unspecified: Secondary | ICD-10-CM

## 2016-05-03 HISTORY — DX: Dissociative and conversion disorder, unspecified: F44.9

## 2016-05-03 HISTORY — DX: Conversion disorder with seizures or convulsions: F44.5

## 2016-05-09 DIAGNOSIS — Z5989 Other problems related to housing and economic circumstances: Secondary | ICD-10-CM

## 2016-05-09 DIAGNOSIS — Z5971 Insufficient health insurance coverage: Secondary | ICD-10-CM

## 2016-05-09 HISTORY — DX: Insufficient health insurance coverage: Z59.71

## 2016-05-09 HISTORY — DX: Other problems related to housing and economic circumstances: Z59.89

## 2016-05-10 DIAGNOSIS — Z91199 Patient's noncompliance with other medical treatment and regimen due to unspecified reason: Secondary | ICD-10-CM

## 2016-05-10 DIAGNOSIS — E0500 Thyrotoxicosis with diffuse goiter without thyrotoxic crisis or storm: Secondary | ICD-10-CM

## 2016-05-10 HISTORY — DX: Patient's noncompliance with other medical treatment and regimen due to unspecified reason: Z91.199

## 2016-05-10 HISTORY — DX: Thyrotoxicosis with diffuse goiter without thyrotoxic crisis or storm: E05.00

## 2016-05-22 ENCOUNTER — Emergency Department (HOSPITAL_BASED_OUTPATIENT_CLINIC_OR_DEPARTMENT_OTHER)
Admission: EM | Admit: 2016-05-22 | Discharge: 2016-05-22 | Disposition: A | Discharge status: Home / Self Care | Payer: Self-pay | Attending: Emergency Medicine | Admitting: Emergency Medicine

## 2016-05-22 ENCOUNTER — Emergency Department (HOSPITAL_BASED_OUTPATIENT_CLINIC_OR_DEPARTMENT_OTHER): Payer: Self-pay

## 2016-05-22 ENCOUNTER — Encounter (HOSPITAL_BASED_OUTPATIENT_CLINIC_OR_DEPARTMENT_OTHER): Payer: Self-pay | Admitting: *Deleted

## 2016-05-22 DIAGNOSIS — E0590 Thyrotoxicosis, unspecified without thyrotoxic crisis or storm: Secondary | ICD-10-CM | POA: Insufficient documentation

## 2016-05-22 DIAGNOSIS — K0500 Acute gingivitis, plaque induced: Secondary | ICD-10-CM | POA: Insufficient documentation

## 2016-05-22 DIAGNOSIS — F17210 Nicotine dependence, cigarettes, uncomplicated: Secondary | ICD-10-CM | POA: Insufficient documentation

## 2016-05-22 DIAGNOSIS — J209 Acute bronchitis, unspecified: Secondary | ICD-10-CM | POA: Insufficient documentation

## 2016-05-22 DIAGNOSIS — Z79899 Other long term (current) drug therapy: Secondary | ICD-10-CM | POA: Insufficient documentation

## 2016-05-22 DIAGNOSIS — I10 Essential (primary) hypertension: Secondary | ICD-10-CM | POA: Insufficient documentation

## 2016-05-22 HISTORY — DX: Essential (primary) hypertension: I10

## 2016-05-22 HISTORY — DX: Unspecified convulsions: R56.9

## 2016-05-22 HISTORY — DX: Personal history of other endocrine, nutritional and metabolic disease: Z86.39

## 2016-05-22 HISTORY — DX: Unspecified exophthalmos: H05.20

## 2016-05-22 HISTORY — DX: Nontoxic goiter, unspecified: E04.9

## 2016-05-22 HISTORY — DX: Thyrotoxicosis with diffuse goiter without thyrotoxic crisis or storm: E05.00

## 2016-05-22 MED ORDER — ALBUTEROL SULFATE HFA 108 (90 BASE) MCG/ACT IN AERS
2.0000 | INHALATION_SPRAY | Freq: Once | RESPIRATORY_TRACT | Status: AC
  Administered 2016-05-22: 2 via RESPIRATORY_TRACT
  Filled 2016-05-22: qty 6.7

## 2016-05-22 NOTE — Discharge Instructions (Signed)
Acute Bronchitis Use your inhaler 2 puffs every 4 hours as needed for cough or shortness of breath. Return if needed more than every 4 hours or see your doctor. See your primary care physician if you continue to have cough in 10-14 days. Return if your condition worsens for any reason. Call any of the numbers on the resource guide to arrange to see a dentist regarding your painful gums. Stay away from cigarettes. If you have problems continuing to refrain from smoking, ask your doctor for help stop smoking. [employer] [facility] The United [employer] 211 is a great source of information about community services available.  Access by dialing 2-1-1 from anywhere in [place], or by website -  PooledIncome.plwww.nc211.org.   Other Local Resources (Updated 11/2015)  Dental  Care   Services    Phone Number and Address  Cost  [facility] For children 360 - 26 years of age:   Cleaning  Tooth brushing/flossing instruction  Sealants, fillings, crowns  Extractions  Emergency treatment  [phone] [address] [place], [place] [address] Charges based on family income.  Medicaid and some insurance plans accepted.     [facility]  Cleaning  Sealants, fillings, crowns  Extractions  Emergency treatment [phone] [address] [place], [place]  Pregnant women 26 years of age or older with a Medicaid card  [facility] - [place]  Cleaning  Sealants, fillings, crowns  Extractions  Emergency treatment [phone] [address] [place], [place] Pregnant women 26 years of age or older with a Medicaid card  [facility] For children 190 - 26 years of age:   Cleaning  Tooth brushing/flossing instruction  Sealants, fillings, crowns  Extractions  Emergency treatment Limited orthodontic services for patients with  Medicaid [phone] [address] [place], [place] [address] Medicaid and [facility] Health Choice cover for children up to age 26 and pregnant women.  Parents of children up to age 321 without Medicaid pay a reduced fee at time of service.  [facility] [place] For children 880 - 10621 years of age:   Cleaning  Tooth brushing/flossing instruction  Sealants, fillings, crowns  Extractions  Emergency treatment Limited orthodontic services for patients with Medicaid [phone] [address] [place], [place].  Medicaid and [place] Health Choice cover for children up to age 26 and pregnant women.  Parents of children up to age 821 without Medicaid pay a reduced fee.  [facility]  Cleaning  Sealants, fillings, crowns  Extractions  Hours: Tuesdays and Thursdays, 4:15 - 8 pm [phone] [address] [place], [place] [address] Services free of charge to [facility] residents ages 18-64 who do not have health insurance, Medicare, [place], or [place] benefits and fall within federal poverty guidelines  [facility]    Provides dental care in addition to primary medical care, nutritional counseling, and pharmacy:  Nurse, mental healthCleaning  Sealants, fillings, crowns  Extractions                  [phone] [facility], [address] [place], [place]  [phone] [facility], [address] [place], [place]  [phone] [facility] [place], [place]  [phone] [facility], [address] [place], [place]  [phone] [facility]  [address] [place], [place] Accepts Medicaid, [employer], most insurance.  Also provides services available to all with fees adjusted based on ability to pay.    [facility]  Cleaning  Tooth  brushing/flossing instruction  Sealants, fillings, crowns  Extractions  Emergency treatment Hours: Tuesdays, Thursdays, and Fridays from 8 am to 5 pm by appointment only. [phone] [address] [place], [place] [address] [facility] residents with Medicaid (depending on eligibility) and children with [facility] Health Choice - call for more information.  [facility]  Extractions only  Hours: 2nd and 4th Thursday of each month from 6:30 am - 9 am.   [phone]. [phone] [place], [place] [address] Ages 75 and older only.  Patients are seen on a first come, first served basis.  [employer]  [employer]  Extractions  Orthodontics  Endodontics  Implants/Crowns/Bridges  Complete and partial dentures [phone] [place], [place] Patients must complete an application for services.  There is often a waiting list.    painful gums appear take Tylenol as directed for pain Bronchitis is inflammation of the airways that extend from the windpipe into the lungs (bronchi). The inflammation often causes mucus to develop. This leads to a cough, which is the most common symptom of bronchitis.  In acute bronchitis, the condition usually develops suddenly and goes away over time, usually in a couple weeks. Smoking, allergies, and asthma can make bronchitis worse. Repeated episodes of bronchitis may cause further lung problems.  CAUSES Acute bronchitis is most often caused by the same virus that causes a cold. The virus can spread from person to person (contagious) through coughing, sneezing, and touching contaminated objects. SIGNS AND SYMPTOMS   Cough.   Fever.   Coughing up mucus.   Body aches.   Chest congestion.   Chills.   Shortness of breath.   Sore throat.  DIAGNOSIS  Acute bronchitis is usually diagnosed through a physical exam. Your health care provider will also ask you questions about your medical history. Tests, such as chest  X-rays, are sometimes done to rule out other conditions.  TREATMENT  Acute bronchitis usually goes away in a couple weeks. Oftentimes, no medical treatment is necessary. Medicines are sometimes given for relief of fever or cough. Antibiotic medicines are usually not needed but may be prescribed in certain situations. In some cases, an inhaler may be recommended to help reduce shortness of breath and control the cough. A cool mist vaporizer may also be used to help thin bronchial secretions and make it easier to clear the chest.  HOME CARE INSTRUCTIONS  Get plenty of rest.   Drink enough fluids to keep your urine clear or pale yellow (unless you have a medical condition that requires fluid restriction). Increasing fluids may help thin your respiratory secretions (sputum) and reduce chest congestion, and it will prevent dehydration.   Take medicines only as directed by your health care provider.  If you were prescribed an antibiotic medicine, finish it all even if you start to feel better.  Avoid smoking and secondhand smoke. Exposure to cigarette smoke or irritating chemicals will make bronchitis worse. If you are a smoker, consider using nicotine gum or skin patches to help control withdrawal symptoms. Quitting smoking will help your lungs heal faster.   Reduce the chances of another bout of acute bronchitis by washing your hands frequently, avoiding people with cold symptoms, and trying not to touch your hands  to your mouth, nose, or eyes.   Keep all follow-up visits as directed by your health care provider.  SEEK MEDICAL CARE IF: Your symptoms do not improve after 1 week of treatment.  SEEK IMMEDIATE MEDICAL CARE IF:  You develop an increased fever or chills.   You have chest pain.   You have severe shortness of breath.  You have bloody sputum.   You develop dehydration.  You faint or repeatedly feel like you are going to pass out.  You develop repeated vomiting.  You  develop a severe headache. MAKE SURE YOU:   Understand these instructions.  Will watch your condition.  Will get help right away if you are not doing well or get worse.   This information is not intended to replace advice given to you by your health care provider. Make sure you discuss any questions you have with your health care provider.   Document Released: 11/29/2004 Document Revised: 11/12/2014 Document Reviewed: 04/14/2013 Elsevier Interactive Patient Education [employer]

## 2016-05-22 NOTE — ED Provider Notes (Addendum)
CSN: [id number]     Arrival date & time 05/22/16  1959 History  By signing my name below, I, Placido SouLogan Joldersma, attest that this documentation has been prepared under the direction and in the presence of Doug SouSam Leonia Heatherly, MD. Electronically Signed: Placido SouLogan Joldersma, ED Scribe. 05/22/2016. 9:06 PM.   Chief Complaint  Patient presents with  . Cough   The history is provided by the patient. No language interpreter was used.    HPI Comments: April HoldingJamie Urbanek is a 25 y.o. female with a PMHx of graves disease and hyperthyroidism who presents to the Emergency Department complaining of constant, mild, unproductive cough x 2 weeks. Pt states that she was [facility] for seizures related to her hyperthyroidism and since being d/c on 05/03/2016 began experiencing her cough. She reports associated, mild, pain across her gums and denies having been evaluated by a dentist for more than 10 years. Pt has taken Children's Motrin w/o significant relief. Her LNMP was 1 week ago. Pt denies ETOH consumption or illegal narcotic use. She states she quit smoking once being admitted to the hospital. She denies any known drug allergies. Pt is followed at a local clinic and her endocrinologist is [facility]. She denies SOB. No fever. No other associated symptoms   Past Medical History  Diagnosis Date  . Hyperthyroidism 2016  . Exophthalmia   . Seizures (HCC)   . Hypertension   . Graves disease   . Goiter   . H/O goiter    History reviewed. No pertinent past surgical history. No family history on file. Social History  Substance Use Topics  . Smoking status: Light Tobacco Smoker -- 0.25 packs/day    Types: Cigarettes  . Smokeless tobacco: None  . Alcohol Use: No  Quit smoking 1 week ago OB History    No data available     Review of Systems  Constitutional: Negative.   HENT: Positive for dental problem.        Pain at gingiva diffusely. Nose swollen and painful at  right nare  Respiratory: Positive for cough.   Cardiovascular: Negative.   Gastrointestinal: Negative.   Musculoskeletal: Negative.   Skin: Negative.   Neurological: Negative.   Psychiatric/Behavioral: Negative.   All other systems reviewed and are negative.   Allergies  Review of patient's allergies indicates no known allergies.  Home Medications   Prior to Admission medications   Medication Sig Start Date End Date Taking? Authorizing Provider  METOPROLOL TARTRATE PO Take by mouth.   Yes Historical Provider, MD  colchicine 0.6 MG tablet Take 1 tablet (0.6 mg total) by mouth daily. 02/24/16   Rolland PorterMark James, MD  HYDROcodone-acetaminophen (NORCO/VICODIN) 5-325 MG tablet Take 2 tablets by mouth every 4 (four) hours as needed. 02/24/16   Rolland PorterMark James, MD  indomethacin (INDOCIN) 25 MG capsule Take 1 capsule (25 mg total) by mouth 2 (two) times daily with a meal. 02/24/16   Rolland PorterMark James, MD  methimazole (TAPAZOLE) 10 MG tablet Take 1 tablet (10 mg total) by mouth 3 (three) times daily. 09/08/15   Geoffery Lyonsouglas Delo, MD  propranolol (INDERAL) 10 MG tablet Take 1 tablet (10 mg total) by mouth 3 (three) times daily. 09/08/15   Geoffery Lyonsouglas Delo, MD   BP 125/109 mmHg  Pulse 73  Temp(Src) 99 F (37.2 C) (Oral)  Resp 20  Ht 5\' 5"  (1.651 m)  Wt 155 lb (70.308 kg)  BMI 25.79 kg/m2  SpO2 98%  LMP 05/08/2016 Physical Exam  Constitutional: She is oriented to person, place, and time. She appears well-developed and well-nourished. No distress.  HENT:  Head: Normocephalic and atraumatic.  Right Ear: External ear normal.  Left Ear: External ear normal.  Nose: Nose normal.  Nose normal  Eyes: Conjunctivae are normal. Pupils are equal, round, and reactive to light.  Exophthalmus bilaterally  Neck: Neck supple. No tracheal deviation present. No thyromegaly present.  Cardiovascular: Normal rate and regular rhythm.   No murmur heard. Pulmonary/Chest: Effort normal.  Minimal end expiratory wheezes  Abdominal: Soft.  Bowel sounds are normal. She exhibits no distension. There is no tenderness.  Musculoskeletal: Normal range of motion. She exhibits no edema or tenderness.  Neurological: She is alert and oriented to person, place, and time. No cranial nerve deficit. Coordination normal.  Skin: Skin is warm and dry. No rash noted.  Psychiatric: She has a normal mood and affect.  Nursing note and vitals reviewed.   ED Course  Procedures  DIAGNOSTIC STUDIES: Oxygen Saturation is 98% on RA, normal by my interpretation.    COORDINATION OF CARE: 9:04 PM Discussed next steps with pt. Pt verbalized understanding and is agreeable with the plan.   Labs Review Labs Reviewed - No data to display  Imaging Review No results found. I have personally reviewed and evaluated these images and lab results as part of my medical decision-making.   EKG Interpretation None     Chest x-ray viewed by me Results for orders placed or performed during the hospital encounter of 02/24/16  CBC with Differential/Platelet  Result Value Ref Range   WBC 6.6 4.0 - 10.5 K/uL   RBC 4.52 3.87 - 5.11 MIL/uL   Hemoglobin 12.2 12.0 - 15.0 g/dL   HCT 16.1 (L) 09.6 - 04.5 %   MCV 78.8 78.0 - 100.0 fL   MCH 27.0 26.0 - 34.0 pg   MCHC 34.3 30.0 - 36.0 g/dL   RDW 40.9 81.1 - 91.4 %   Platelets 188 150 - 400 K/uL   Neutrophils Relative % 38 %   Neutro Abs 2.5 1.7 - 7.7 K/uL   Lymphocytes Relative 52 %   Lymphs Abs 3.4 0.7 - 4.0 K/uL   Monocytes Relative 9 %   Monocytes Absolute 0.6 0.1 - 1.0 K/uL   Eosinophils Relative 2 %   Eosinophils Absolute 0.1 0.0 - 0.7 K/uL   Basophils Relative 0 %   Basophils Absolute 0.0 0.0 - 0.1 K/uL  Sedimentation rate  Result Value Ref Range   Sed Rate 36 (H) 0 - 22 mm/hr  Uric acid  Result Value Ref Range   Uric Acid, Serum 5.6 2.3 - 6.6 mg/dL   Dg Chest 2 View  7/82/9562  CLINICAL DATA:  Cough for 1 week.  Left upper chest pain. EXAM: CHEST  2 VIEW COMPARISON:  05/03/2016, 12/06/2015  FINDINGS: Lung volumes are low. Borderline mild cardiomegaly appears similar to prior exams. Mediastinal contours are normal. No confluent airspace disease, pulmonary edema, pleural effusion or pneumothorax. No acute osseous abnormalities. IMPRESSION: Borderline mild cardiomegaly, stable from priors. Electronically Signed   By: Rubye Oaks M.D.   On: 05/22/2016 21:16    MDM  Patient may have mild gingivitis though no swelling or fluctuance on exam. She'll be referred to resource guide for dental care as she has not seen a dentist in several years. Tylenol for pain. Albuterol HFA to go to use 2 puffs every 4 hours needed for cough or shortness of breath. Follow-up with primary care physician  if still coughing in 10-14 days. She is encouraged to stay away from cigarettes and ask her primary care physician to help her refrain from tobacco if needed Diagnosis #1 acute bronchitis #2 gingivitis Final diagnoses:  None          Doug Sou, MD 05/22/16 2159  Doug Sou, MD 05/22/16 2159  Doug Sou, MD 05/22/16 2203

## 2016-05-22 NOTE — ED Notes (Signed)
Cough x 2 weeks

## 2016-09-11 DIAGNOSIS — Z9109 Other allergy status, other than to drugs and biological substances: Secondary | ICD-10-CM

## 2016-09-11 HISTORY — DX: Other allergy status, other than to drugs and biological substances: Z91.09

## 2016-09-13 DIAGNOSIS — R0981 Nasal congestion: Secondary | ICD-10-CM

## 2016-09-13 DIAGNOSIS — R490 Dysphonia: Secondary | ICD-10-CM

## 2016-09-13 HISTORY — DX: Dysphonia: R49.0

## 2016-09-13 HISTORY — DX: Nasal congestion: R09.81

## 2016-09-24 ENCOUNTER — Emergency Department (HOSPITAL_BASED_OUTPATIENT_CLINIC_OR_DEPARTMENT_OTHER)
Admission: EM | Admit: 2016-09-24 | Discharge: 2016-09-24 | Disposition: A | Discharge status: Home / Self Care | Payer: Self-pay | Attending: Emergency Medicine | Admitting: Emergency Medicine

## 2016-09-24 ENCOUNTER — Encounter (HOSPITAL_BASED_OUTPATIENT_CLINIC_OR_DEPARTMENT_OTHER): Payer: Self-pay

## 2016-09-24 DIAGNOSIS — F17210 Nicotine dependence, cigarettes, uncomplicated: Secondary | ICD-10-CM | POA: Insufficient documentation

## 2016-09-24 DIAGNOSIS — Z9114 Patient's other noncompliance with medication regimen: Secondary | ICD-10-CM | POA: Insufficient documentation

## 2016-09-24 DIAGNOSIS — E0590 Thyrotoxicosis, unspecified without thyrotoxic crisis or storm: Secondary | ICD-10-CM | POA: Insufficient documentation

## 2016-09-24 DIAGNOSIS — I10 Essential (primary) hypertension: Secondary | ICD-10-CM | POA: Insufficient documentation

## 2016-09-24 DIAGNOSIS — Z79899 Other long term (current) drug therapy: Secondary | ICD-10-CM | POA: Insufficient documentation

## 2016-09-24 LAB — COMPREHENSIVE METABOLIC PANEL
ALBUMIN: 3.7 g/dL (ref 3.5–5.0)
ALK PHOS: 172 U/L — AB (ref 38–126)
ALT: 30 U/L (ref 14–54)
ANION GAP: 7 (ref 5–15)
AST: 31 U/L (ref 15–41)
BILIRUBIN TOTAL: 0.4 mg/dL (ref 0.3–1.2)
BUN: 11 mg/dL (ref 6–20)
CALCIUM: 9.4 mg/dL (ref 8.9–10.3)
CO2: 22 mmol/L (ref 22–32)
Chloride: 109 mmol/L (ref 101–111)
Creatinine, Ser: <0.3 mg/dL — ABNORMAL LOW (ref 0.44–1.00)
GLUCOSE: 110 mg/dL — AB (ref 65–99)
Potassium: 3.6 mmol/L (ref 3.5–5.1)
Sodium: 138 mmol/L (ref 135–145)
TOTAL PROTEIN: 7.4 g/dL (ref 6.5–8.1)

## 2016-09-24 LAB — URINALYSIS, ROUTINE W REFLEX MICROSCOPIC
BILIRUBIN URINE: NEGATIVE
GLUCOSE, UA: NEGATIVE mg/dL
HGB URINE DIPSTICK: NEGATIVE
KETONES UR: NEGATIVE mg/dL
Leukocytes, UA: NEGATIVE
NITRITE: NEGATIVE
PH: 5.5 (ref 5.0–8.0)
Protein, ur: 30 mg/dL — AB
Specific Gravity, Urine: 1.027 (ref 1.005–1.030)

## 2016-09-24 LAB — CBC WITH DIFFERENTIAL/PLATELET
BASOS PCT: 0 %
Basophils Absolute: 0 10*3/uL (ref 0.0–0.1)
Eosinophils Absolute: 0 10*3/uL (ref 0.0–0.7)
Eosinophils Relative: 0 %
HEMATOCRIT: 35.8 % — AB (ref 36.0–46.0)
HEMOGLOBIN: 12.3 g/dL (ref 12.0–15.0)
LYMPHS ABS: 3 10*3/uL (ref 0.7–4.0)
LYMPHS PCT: 68 %
MCH: 26.6 pg (ref 26.0–34.0)
MCHC: 34.4 g/dL (ref 30.0–36.0)
MCV: 77.3 fL — ABNORMAL LOW (ref 78.0–100.0)
MONOS PCT: 9 %
Monocytes Absolute: 0.4 10*3/uL (ref 0.1–1.0)
NEUTROS ABS: 1 10*3/uL — AB (ref 1.7–7.7)
NEUTROS PCT: 23 %
Platelets: 192 10*3/uL (ref 150–400)
RBC: 4.63 MIL/uL (ref 3.87–5.11)
RDW: 14.2 % (ref 11.5–15.5)
WBC: 4.3 10*3/uL (ref 4.0–10.5)

## 2016-09-24 LAB — TSH: TSH: <0.01 u[IU]/mL — ABNORMAL LOW (ref 0.350–4.500)

## 2016-09-24 LAB — URINE MICROSCOPIC-ADD ON

## 2016-09-24 LAB — WET PREP, GENITAL
CLUE CELLS WET PREP: NONE SEEN
SPERM: NONE SEEN
Trich, Wet Prep: NONE SEEN
Yeast Wet Prep HPF POC: NONE SEEN

## 2016-09-24 LAB — LIPASE, BLOOD: Lipase: 20 U/L (ref 11–51)

## 2016-09-24 LAB — T4, FREE: Free T4: 5.45 ng/dL — ABNORMAL HIGH (ref 0.61–1.12)

## 2016-09-24 LAB — PREGNANCY, URINE: Preg Test, Ur: NEGATIVE

## 2016-09-24 MED ORDER — METOPROLOL SUCCINATE ER 25 MG PO TB24: 75.0000 mg | ORAL_TABLET | Freq: Every day | ORAL | 0 refills | Status: DC

## 2016-09-24 MED ORDER — ONDANSETRON HCL 4 MG/2ML IJ SOLN
4.0000 mg | Freq: Once | INTRAMUSCULAR | Status: AC
  Administered 2016-09-24: 4 mg via INTRAVENOUS
  Filled 2016-09-24: qty 2

## 2016-09-24 MED ORDER — SODIUM CHLORIDE 0.9 % IV BOLUS (SEPSIS)
1000.0000 mL | Freq: Once | INTRAVENOUS | Status: AC
  Administered 2016-09-24: 1000 mL via INTRAVENOUS

## 2016-09-24 NOTE — ED Notes (Signed)
Dr. Ocie CornfieldMonica Hodge will be returning call to Dr. [phone]

## 2016-09-24 NOTE — ED Provider Notes (Signed)
[facility] Provider Note   CSN: [id number] Arrival date & time: 09/24/16  [id number]     History   Chief Complaint Chief Complaint  Patient presents with  . Abdominal Pain    HPI Sheri Hodge is a 25 y.o. female hx of graves disease, hyperthyroidism With medication noncompliance here presenting with palpitations, abdominal pain, nausea, diarrhea. Patient states that she's been having palpitations for the last several weeks. Also had some lower abdominal pain as well as nausea. Patient has been having chronic diarrhea for the last 3 weeks. She has some clear vaginal discharge as well. She does have a sexual partner but states that her partner does not have anything that she knows of. Denies any urinary symptoms. [facility] but left without being seen. It actually has [facility] Last seen Sheri Hodge on 10/24 and was told to take methimazole 10 mg TID and continue metoprolol 50 mg daily. She did miss a dose of methimazole yesterday.   The history is provided by the patient.    Past Medical History:  Diagnosis Date  . Exophthalmia   . Goiter   . Graves disease   . H/O goiter   . Hypertension   . Hyperthyroidism 2016  . Seizures (HCC)     There are no active problems to display for this patient.   History reviewed. No pertinent surgical history.  OB History    No data available       Home Medications    Prior to Admission medications   Medication Sig Start Date End Date Taking? Authorizing Provider  methimazole (TAPAZOLE) 10 MG tablet Take 1 tablet (10 mg total) by mouth 3 (three) times daily. 09/08/15  Yes Sheri Lyonsouglas Delo, MD  METOPROLOL TARTRATE PO Take by mouth.   Yes Historical Provider, MD  colchicine 0.6 MG tablet Take 1 tablet (0.6 mg total) by mouth daily. 02/24/16   Sheri PorterMark James, MD  HYDROcodone-acetaminophen (NORCO/VICODIN) 5-325 MG tablet Take 2 tablets by mouth every 4 (four) hours as  needed. 02/24/16   Sheri PorterMark James, MD  indomethacin (INDOCIN) 25 MG capsule Take 1 capsule (25 mg total) by mouth 2 (two) times daily with a meal. 02/24/16   Sheri PorterMark James, MD  propranolol (INDERAL) 10 MG tablet Take 1 tablet (10 mg total) by mouth 3 (three) times daily. 09/08/15   Sheri Lyonsouglas Delo, MD    Family History No family history on file.  Social History Social History  Substance Use Topics  . Smoking status: Light Tobacco Smoker    Packs/day: 0.25    Types: Cigarettes  . Smokeless tobacco: Never Used  . Alcohol use No     Allergies   Patient has no known allergies.   Review of Systems Review of Systems  Gastrointestinal: Positive for diarrhea.  All other systems reviewed and are negative.    Physical Exam Updated Vital Signs BP 134/81 (BP Location: Right Arm)   Pulse 101   Temp 98.1 F (36.7 C) (Oral)   Resp 16   Ht 5\' 5"  (1.651 m)   Wt 174 lb (78.9 kg)   SpO2 100%   BMI 28.96 kg/m   Physical Exam  Constitutional: She is oriented to person, place, and time.  NAD   HENT:  Head: Normocephalic.  MM slightly dry   Eyes: EOM are normal. Pupils are equal, round, and reactive to light.  Neck: Normal range of motion. Neck supple.  Goiter, nontender   Cardiovascular: Regular rhythm and normal heart sounds.   Mildly tachy   Pulmonary/Chest: Effort normal and breath sounds normal. No respiratory distress. She has no wheezes. She has no rales.  Abdominal: Soft. Bowel sounds are normal. She exhibits no distension. There is no tenderness. There is no guarding.  Genitourinary:  Genitourinary Comments: Whitish discharge. No CMT or uterine or adnexal tenderness   Musculoskeletal: Normal range of motion.  Neurological: She is alert and oriented to person, place, and time.  Skin: Skin is warm.  Psychiatric: She has a normal mood and affect.  Nursing note and vitals reviewed.    ED Treatments / Results  Labs (all labs ordered are listed, but only abnormal results are  displayed) Labs Reviewed  WET PREP, GENITAL - Abnormal; Notable for the following:       Result Value   WBC, Wet Prep HPF POC MANY (*)    All other components within normal limits  CBC WITH DIFFERENTIAL/PLATELET - Abnormal; Notable for the following:    HCT 35.8 (*)    MCV 77.3 (*)    Neutro Abs 1.0 (*)    All other components within normal limits  COMPREHENSIVE METABOLIC PANEL - Abnormal; Notable for the following:    Glucose, Bld 110 (*)    Creatinine, Ser <0.30 (*)    Alkaline Phosphatase 172 (*)    All other components within normal limits  URINALYSIS, ROUTINE W REFLEX MICROSCOPIC (NOT AT [facility]) - Abnormal; Notable for the following:    Color, Urine AMBER (*)    Protein, ur 30 (*)    All other components within normal limits  TSH - Abnormal; Notable for the following:    TSH <0.010 (*)    All other components within normal limits  T4, FREE - Abnormal; Notable for the following:    Free T4 5.45 (*)    All other components within normal limits  URINE MICROSCOPIC-ADD ON - Abnormal; Notable for the following:    Squamous Epithelial / LPF 0-5 (*)    Bacteria, UA MANY (*)    All other components within normal limits  LIPASE, BLOOD  PREGNANCY, URINE  GC/CHLAMYDIA PROBE AMP ([place]) NOT AT [facility]    EKG  EKG Interpretation None       Radiology No results found.  Procedures Procedures (including critical care time)  Medications Ordered in ED Medications  sodium chloride 0.9 % bolus 1,000 mL (0 mLs Intravenous Stopped 09/24/16 0951)  ondansetron (ZOFRAN) injection 4 mg (4 mg Intravenous Given 09/24/16 0845)     Initial Impression / Assessment and Plan / ED Course  I have reviewed the triage vital signs and the nursing notes.  Pertinent labs & imaging results that were available during my care of the patient were reviewed by me and considered in my medical decision making (see chart for details).  Clinical Course    Sheri Hodge is a 25 y.o. female hx of  hyperthyroidism here with chronic palpitations, abdominal pain, diarrhea. Likely symptomatic hyperthyroidism. Has hx of medication uncompliance. TSH in August was < 0.05 and Free T3 in October is elevated. Will repeat labs, TSH. Will call her endocrinologist.   11:42 AM CBC, CMP, lipase unremarkable. TSH < 0.01. T4 5.45, improved from recently. I called Dr. Roanna Raider from endocrine. She states that this is likely from patient's medication uncompliance. She recommend keeping methimazole the same dose and can increase metoprolol to help with palpitations. HR improved to 101 after IVF. Will increase  metoprolol to 75 mg daily. She is scheduled to see Sheri Hodge on 12/2.    Final Clinical Impressions(s) / ED Diagnoses   Final diagnoses:  None    New Prescriptions New Prescriptions   No medications on file     Charlynne Panderavid Hsienta Emil Weigold, MD 09/24/16 1144

## 2016-09-24 NOTE — ED Triage Notes (Signed)
Reports n/v/d and lower abdominal pain. Denies urinary symptoms.

## 2016-09-24 NOTE — Discharge Instructions (Signed)
You need to take methimazole as prescribed.   Increase metoprolol to 75 mg daily to help your palpitations.   See your endocrinologist as scheduled  Return to ER if you have worse palpitations, diarrhea, vomiting, dehydration, abdominal pain.

## 2016-09-24 NOTE — ED Notes (Signed)
Reports TSH levels are abnormal and experiencing n/v/d.  Goiter noted upon assessment.

## 2016-09-25 LAB — GC/CHLAMYDIA PROBE AMP (~~LOC~~) NOT AT ARMC
CHLAMYDIA, DNA PROBE: NEGATIVE
NEISSERIA GONORRHEA: NEGATIVE

## 2016-11-11 ENCOUNTER — Emergency Department (HOSPITAL_BASED_OUTPATIENT_CLINIC_OR_DEPARTMENT_OTHER): Payer: 59

## 2016-11-11 ENCOUNTER — Emergency Department (HOSPITAL_BASED_OUTPATIENT_CLINIC_OR_DEPARTMENT_OTHER)
Admission: EM | Admit: 2016-11-11 | Discharge: 2016-11-12 | Disposition: A | Discharge status: Home / Self Care | Payer: 59 | Attending: Emergency Medicine | Admitting: Emergency Medicine

## 2016-11-11 ENCOUNTER — Encounter (HOSPITAL_BASED_OUTPATIENT_CLINIC_OR_DEPARTMENT_OTHER): Payer: Self-pay | Admitting: *Deleted

## 2016-11-11 DIAGNOSIS — R195 Other fecal abnormalities: Secondary | ICD-10-CM | POA: Insufficient documentation

## 2016-11-11 DIAGNOSIS — I10 Essential (primary) hypertension: Secondary | ICD-10-CM | POA: Insufficient documentation

## 2016-11-11 DIAGNOSIS — R1031 Right lower quadrant pain: Secondary | ICD-10-CM | POA: Insufficient documentation

## 2016-11-11 DIAGNOSIS — F17210 Nicotine dependence, cigarettes, uncomplicated: Secondary | ICD-10-CM | POA: Insufficient documentation

## 2016-11-11 DIAGNOSIS — R197 Diarrhea, unspecified: Secondary | ICD-10-CM | POA: Insufficient documentation

## 2016-11-11 DIAGNOSIS — R112 Nausea with vomiting, unspecified: Secondary | ICD-10-CM

## 2016-11-11 DIAGNOSIS — R1013 Epigastric pain: Secondary | ICD-10-CM | POA: Insufficient documentation

## 2016-11-11 DIAGNOSIS — Z79899 Other long term (current) drug therapy: Secondary | ICD-10-CM | POA: Insufficient documentation

## 2016-11-11 LAB — URINALYSIS, ROUTINE W REFLEX MICROSCOPIC
Bilirubin Urine: NEGATIVE
GLUCOSE, UA: NEGATIVE mg/dL
Ketones, ur: NEGATIVE mg/dL
LEUKOCYTES UA: NEGATIVE
Nitrite: NEGATIVE
PROTEIN: 30 mg/dL — AB
Specific Gravity, Urine: 1.027 (ref 1.005–1.030)
pH: 5.5 (ref 5.0–8.0)

## 2016-11-11 LAB — COMPREHENSIVE METABOLIC PANEL
ALT: 27 U/L (ref 14–54)
AST: 33 U/L (ref 15–41)
Albumin: 4.2 g/dL (ref 3.5–5.0)
Alkaline Phosphatase: 206 U/L — ABNORMAL HIGH (ref 38–126)
Anion gap: 12 (ref 5–15)
BUN: 10 mg/dL (ref 6–20)
CO2: 20 mmol/L — ABNORMAL LOW (ref 22–32)
Calcium: 9.1 mg/dL (ref 8.9–10.3)
Chloride: 104 mmol/L (ref 101–111)
Creatinine, Ser: 0.36 mg/dL — ABNORMAL LOW (ref 0.44–1.00)
GFR calc Af Amer: >60 mL/min (ref >60)
GFR calc non Af Amer: >60 mL/min (ref >60)
Glucose, Bld: 124 mg/dL — ABNORMAL HIGH (ref 65–99)
Potassium: 3.9 mmol/L (ref 3.5–5.1)
Sodium: 136 mmol/L (ref 135–145)
Total Bilirubin: 0.7 mg/dL (ref 0.3–1.2)
Total Protein: 8 g/dL (ref 6.5–8.1)

## 2016-11-11 LAB — CBC WITH DIFFERENTIAL/PLATELET
Basophils Absolute: 0 10*3/uL (ref 0.0–0.1)
Basophils Relative: 0 %
Eosinophils Absolute: 0 10*3/uL (ref 0.0–0.7)
Eosinophils Relative: 0 %
HCT: 45 % (ref 36.0–46.0)
Hemoglobin: 15.5 g/dL — ABNORMAL HIGH (ref 12.0–15.0)
Lymphocytes Relative: 9 %
Lymphs Abs: 0.7 10*3/uL (ref 0.7–4.0)
MCH: 26.8 pg (ref 26.0–34.0)
MCHC: 34.4 g/dL (ref 30.0–36.0)
MCV: 77.9 fL — ABNORMAL LOW (ref 78.0–100.0)
Monocytes Absolute: 0.4 10*3/uL (ref 0.1–1.0)
Monocytes Relative: 4 %
Neutro Abs: 7.1 10*3/uL (ref 1.7–7.7)
Neutrophils Relative %: 87 %
Platelets: 157 10*3/uL (ref 150–400)
RBC: 5.78 MIL/uL — ABNORMAL HIGH (ref 3.87–5.11)
RDW: 14.2 % (ref 11.5–15.5)
WBC: 8.2 10*3/uL (ref 4.0–10.5)

## 2016-11-11 LAB — LIPASE, BLOOD: Lipase: 20 U/L (ref 11–51)

## 2016-11-11 LAB — PREGNANCY, URINE: Preg Test, Ur: NEGATIVE

## 2016-11-11 LAB — OCCULT BLOOD X 1 CARD TO LAB, STOOL: Fecal Occult Bld: NEGATIVE

## 2016-11-11 LAB — URINALYSIS, MICROSCOPIC (REFLEX)

## 2016-11-11 MED ORDER — SODIUM CHLORIDE 0.9 % IV BOLUS (SEPSIS)
1000.0000 mL | Freq: Once | INTRAVENOUS | Status: AC
  Administered 2016-11-11: 1000 mL via INTRAVENOUS

## 2016-11-11 MED ORDER — IOPAMIDOL (ISOVUE-300) INJECTION 61%
100.0000 mL | Freq: Once | INTRAVENOUS | Status: AC | PRN
  Administered 2016-11-11: 100 mL via INTRAVENOUS

## 2016-11-11 MED ORDER — ONDANSETRON HCL 4 MG/2ML IJ SOLN
4.0000 mg | Freq: Once | INTRAMUSCULAR | Status: AC
  Administered 2016-11-11: 4 mg via INTRAVENOUS
  Filled 2016-11-11: qty 2

## 2016-11-11 NOTE — ED Triage Notes (Signed)
N/V/D x 2 days.  Pt appears weak in triage.  Ambulatory.  Tachypnea.

## 2016-11-11 NOTE — ED Provider Notes (Signed)
[facility] Provider Note   CSN: [id number] Arrival date & time: 11/11/16  1936  By signing my name below, I, Sheri Hodge, attest that this documentation has been prepared under the direction and in the presence of Axell Trigueros M Tlaloc Taddei, PA-C.  Electronically Signed: Octavia Hodge, ED Scribe. 11/11/16. 9:19 PM.    History   Chief Complaint Chief Complaint  Patient presents with  . Emesis   The history is provided by the patient. No language interpreter was used.   HPI Comments: Sheri Hodge is a 26 y.o. female who has a PMhx of graves disease, exphthlamia, HTN, hyperthyroidism, seizures presents to the Emergency Department complaining of sudden onset, moderate, nausea, vomiting, diarrhea x 1 day. She expresses RLQ/epigastric abdominal pain, vomiting and diarrhea about every 10-15 minutes. Pt also says she has noticed that her diarrhea is pink tinged which started today. She notes eating a normal meal yesterday prior to her symptoms starting. Pt has not been able to keep down any fluids or solid food due to vomiting. She has no abdominal surgical history. Pt denies fever, abnormal vaginal discharge, pelvic pain, dysuria, and hematuria.   Past Medical History:  Diagnosis Date  . Exophthalmia   . Goiter   . Graves disease   . H/O goiter   . Hypertension   . Hyperthyroidism 2016  . Seizures (HCC)     There are no active problems to display for this patient.   History reviewed. No pertinent surgical history.  OB History    No data available       Home Medications    Prior to Admission medications   Medication Sig Start Date End Date Taking? Authorizing Provider  indomethacin (INDOCIN) 25 MG capsule Take 1 capsule (25 mg total) by mouth 2 (two) times daily with a meal. 02/24/16   Rolland Porter, MD  methimazole (TAPAZOLE) 10 MG tablet Take 1 tablet (10 mg total) by mouth 3 (three) times daily. 09/08/15   Geoffery Lyons, MD  metoprolol succinate (TOPROL-XL) 25 MG 24 hr  tablet Take 3 tablets (75 mg total) by mouth daily. 09/24/16   Charlynne Pander, MD  ondansetron (ZOFRAN) 4 MG tablet Take 1 tablet (4 mg total) by mouth every 6 (six) hours. 11/12/16   Emi Holes, PA-C    Family History History reviewed. No pertinent family history.  Social History Social History  Substance Use Topics  . Smoking status: Light Tobacco Smoker    Packs/day: 0.25    Types: Cigarettes  . Smokeless tobacco: Never Used  . Alcohol use No     Allergies   Patient has no known allergies.   Review of Systems Review of Systems  Constitutional: Negative for chills and fever.  HENT: Negative for facial swelling and sore throat.   Respiratory: Negative for shortness of breath.   Cardiovascular: Negative for chest pain.  Gastrointestinal: Positive for abdominal pain, blood in stool, diarrhea, nausea and vomiting.  Genitourinary: Negative for dysuria, hematuria, pelvic pain, vaginal discharge and vaginal pain.  Musculoskeletal: Negative for back pain.  Skin: Negative for rash and wound.  Neurological: Negative for headaches.  Psychiatric/Behavioral: The patient is not nervous/anxious.      Physical Exam Updated Vital Signs BP 143/73   Pulse 120   Temp 98.3 F (36.8 C) (Oral)   Resp (!) 30   Ht 5\' 5"  (1.651 m)   Wt 72.2 kg   LMP 10/11/2016   SpO2 100%   BMI 26.48 kg/m   Physical Exam  Constitutional: She appears well-developed and well-nourished. No distress.  HENT:  Head: Normocephalic and atraumatic.  Mouth/Throat: Oropharynx is clear and moist. No oropharyngeal exudate.  Eyes: Conjunctivae are normal. Pupils are equal, round, and reactive to light. Right eye exhibits no discharge. Left eye exhibits no discharge. No scleral icterus.  Neck: Normal range of motion. Neck supple. No thyromegaly present.  Cardiovascular: Normal rate, regular rhythm, normal heart sounds and intact distal pulses.  Exam reveals no gallop and no friction rub.   No murmur  heard. Pulmonary/Chest: Effort normal and breath sounds normal. No stridor. No respiratory distress. She has no wheezes. She has no rales.  Abdominal: Soft. Bowel sounds are normal. She exhibits no distension. There is tenderness. There is no rebound and no guarding.  RLQ and epigastric TTP  Genitourinary: Rectal exam shows no external hemorrhoid, no internal hemorrhoid and guaiac negative stool.  Musculoskeletal: She exhibits no edema.  Lymphadenopathy:    She has no cervical adenopathy.  Neurological: She is alert. Coordination normal.  Skin: Skin is warm and dry. No rash noted. She is not diaphoretic. No pallor.  Psychiatric: She has a normal mood and affect.  Nursing note and vitals reviewed.    ED Treatments / Results  DIAGNOSTIC STUDIES: Oxygen Saturation is 100% on RA, normal by my interpretation.  COORDINATION OF CARE:  9:18 PM Discussed treatment plan with pt at bedside and pt agreed to plan.  Labs (all labs ordered are listed, but only abnormal results are displayed) Labs Reviewed  URINALYSIS, ROUTINE W REFLEX MICROSCOPIC - Abnormal; Notable for the following:       Result Value   Color, Urine AMBER (*)    APPearance CLOUDY (*)    Hgb urine dipstick TRACE (*)    Protein, ur 30 (*)    All other components within normal limits  URINALYSIS, MICROSCOPIC (REFLEX) - Abnormal; Notable for the following:    Bacteria, UA FEW (*)    Squamous Epithelial / LPF 0-5 (*)    All other components within normal limits  CBC WITH DIFFERENTIAL/PLATELET - Abnormal; Notable for the following:    RBC 5.78 (*)    Hemoglobin 15.5 (*)    MCV 77.9 (*)    All other components within normal limits  COMPREHENSIVE METABOLIC PANEL - Abnormal; Notable for the following:    CO2 20 (*)    Glucose, Bld 124 (*)    Creatinine, Ser 0.36 (*)    Alkaline Phosphatase 206 (*)    All other components within normal limits  PREGNANCY, URINE  LIPASE, BLOOD  OCCULT BLOOD X 1 CARD TO LAB, STOOL    EKG   EKG Interpretation None       Radiology Ct Abdomen Pelvis W Contrast  Result Date: 11/11/2016 CLINICAL DATA:  26 y/o F; right lower quadrant and epigastric abdominal pain with vomiting and diarrhea. EXAM: CT ABDOMEN AND PELVIS WITH CONTRAST TECHNIQUE: Multidetector CT imaging of the abdomen and pelvis was performed using the standard protocol following bolus administration of intravenous contrast. CONTRAST:  ISOVUE-300 IOPAMIDOL (ISOVUE-300) INJECTION 61% COMPARISON:  None. FINDINGS: Lower chest: No acute abnormality. Hepatobiliary: No focal liver abnormality is seen. No gallstones, gallbladder wall thickening, or biliary dilatation. Pancreas: Unremarkable. No pancreatic ductal dilatation or surrounding inflammatory changes. Spleen: Mild splenomegaly.  12 mm splenule inferior to the spleen. Adrenals/Urinary Tract: Adrenal glands are unremarkable. Kidneys are normal, without renal calculi, focal lesion, or hydronephrosis. Bladder is unremarkable. Stomach/Bowel: Stomach is within normal limits. Appendix appears normal. No  evidence of bowel wall thickening, distention, or inflammatory changes. Vascular/Lymphatic: No significant vascular findings. Diffuse retroperitoneal and mesenteric lymphadenopathy. Reproductive: Uterus and bilateral adnexa are unremarkable. Other: No abdominal wall hernia or abnormality. No abdominopelvic ascites. Musculoskeletal: No acute or significant osseous findings. IMPRESSION: 1. Mild splenomegaly and mild diffuse retroperitoneal and mesenteric lymphadenopathy. Findings likely represent a systemic infectious/inflammatory process or possibly reactive changes to underlying enteritis. Mesenteric adenitis is unlikely and patient's age group, but possible. Additionally, lymphoma can have this appearance, clinical correlation is recommended. 2. Otherwise unremarkable CT of abdomen and pelvis. Normal appendix. Electronically Signed   By: Mitzi HansenLance  Furusawa-Stratton M.D.   On: 11/11/2016  22:47    Procedures Procedures (including critical care time)  Medications Ordered in ED Medications  sodium chloride 0.9 % bolus 1,000 mL (1,000 mLs Intravenous New Bag/Given 11/11/16 2126)  iopamidol (ISOVUE-300) 61 % injection 100 mL (100 mLs Intravenous Contrast Given 11/11/16 2219)  sodium chloride 0.9 % bolus 1,000 mL (1,000 mLs Intravenous New Bag/Given 11/11/16 2315)  ondansetron (ZOFRAN) injection 4 mg (4 mg Intravenous Given 11/11/16 2353)  metoprolol (LOPRESSOR) tablet 25 mg (25 mg Oral Given 11/12/16 0023)     Initial Impression / Assessment and Plan / ED Course  I have reviewed the triage vital signs and the nursing notes.  Pertinent labs & imaging results that were available during my care of the patient were reviewed by me and considered in my medical decision making (see chart for details).  Clinical Course     CBC shows hemoglobin 15.5. CMP shows CO2 20, glucose 125, creatinine 0.36, alkaline phosphatase 206, which is only minorly elevated from last visit. Lipase 20. UA shows trace hematuria, protein 30, few bacteria. Urine pregnancy negative. Fecal occult negative. CT abdomen and pelvis shows 1. Mild splenomegaly and mild diffuse retroperitoneal and mesenteric lymphadenopathy. Findings likely represent a systemic infectious/inflammatory process or possibly reactive changes to underlying enteritis. Mesenteric adenitis is unlikely and patient's age group, but possible. Additionally, lymphoma can have this appearance, clinical correlation is recommended. 2. Otherwise unremarkable CT of abdomen and pelvis. Normal appendix.  Patient symptoms most likely due to a viral gastroenteritis. However patient made aware of her CT findings. We'll treat as such but strict return precautions given and patient advised to follow up with PCP if symptoms are not improving. Patient understands and agrees with plan. Patient discharged home with Zofran. Patient able to tolerate >6 oz of fluid prior  to discharge. Patient with tachycardia (worse with exertion) at baseline due to patient's hyperthyroidism. Patient given metoprolol in the ED due to probable emesis of today's doses. Tachycardia improved with metoprolol. Patient has follow-up with her endocrinologist in the near future. Return precautions discussed. Patient understands and agrees with plan. Patient discharged in satisfactory condition. I discussed patient case with Dr. Anitra LauthPlunkett who guided the patient's management and agrees with plan.  Final Clinical Impressions(s) / ED Diagnoses   Final diagnoses:  Nausea vomiting and diarrhea   I personally performed the services described in this documentation, which was scribed in my presence. The recorded information has been reviewed and is accurate.  New Prescriptions New Prescriptions   ONDANSETRON (ZOFRAN) 4 MG TABLET    Take 1 tablet (4 mg total) by mouth every 6 (six) hours.     Emi Holeslexandra M Aseel Truxillo, PA-C 11/12/16 [id number]    Gwyneth SproutWhitney Plunkett, MD 11/13/16 [phone]

## 2016-11-11 NOTE — ED Notes (Signed)
Pt back from CT

## 2016-11-11 NOTE — ED Notes (Signed)
Patient transported to CT 

## 2016-11-11 NOTE — ED Notes (Signed)
Attempted VS, PA in there. Will try again in a few minutes.

## 2016-11-12 MED ORDER — ONDANSETRON HCL 4 MG PO TABS: 4.0000 mg | ORAL_TABLET | Freq: Four times a day (QID) | ORAL | 0 refills | Status: DC

## 2016-11-12 MED ORDER — METOPROLOL TARTRATE 50 MG PO TABS
25.0000 mg | ORAL_TABLET | Freq: Once | ORAL | Status: AC
  Administered 2016-11-12: 25 mg via ORAL
  Filled 2016-11-12: qty 1

## 2016-11-12 NOTE — Discharge Instructions (Signed)
Medications: Zofran  Treatment: Take Zofran every 6 hours as needed for nausea and vomiting. Make sure to drink plenty of fluids.  Follow-up: Please return to the emergency department or call your doctor if you develop any new or worsening symptoms or your symptoms are not improving over the next 1-2 days.

## 2017-01-02 DIAGNOSIS — Z720 Tobacco use: Secondary | ICD-10-CM | POA: Insufficient documentation

## 2017-01-02 HISTORY — DX: Tobacco use: Z72.0

## 2017-01-18 ENCOUNTER — Emergency Department (HOSPITAL_BASED_OUTPATIENT_CLINIC_OR_DEPARTMENT_OTHER)
Admission: EM | Admit: 2017-01-18 | Discharge: 2017-01-18 | Disposition: A | Discharge status: Home / Self Care | Payer: 59 | Attending: Emergency Medicine | Admitting: Emergency Medicine

## 2017-01-18 ENCOUNTER — Encounter (HOSPITAL_BASED_OUTPATIENT_CLINIC_OR_DEPARTMENT_OTHER): Payer: Self-pay | Admitting: Emergency Medicine

## 2017-01-18 ENCOUNTER — Emergency Department (HOSPITAL_BASED_OUTPATIENT_CLINIC_OR_DEPARTMENT_OTHER): Payer: 59

## 2017-01-18 DIAGNOSIS — I517 Cardiomegaly: Secondary | ICD-10-CM

## 2017-01-18 DIAGNOSIS — I10 Essential (primary) hypertension: Secondary | ICD-10-CM | POA: Insufficient documentation

## 2017-01-18 DIAGNOSIS — G8918 Other acute postprocedural pain: Secondary | ICD-10-CM | POA: Insufficient documentation

## 2017-01-18 DIAGNOSIS — Z79899 Other long term (current) drug therapy: Secondary | ICD-10-CM | POA: Insufficient documentation

## 2017-01-18 DIAGNOSIS — Z87891 Personal history of nicotine dependence: Secondary | ICD-10-CM | POA: Insufficient documentation

## 2017-01-18 DIAGNOSIS — L309 Dermatitis, unspecified: Secondary | ICD-10-CM | POA: Insufficient documentation

## 2017-01-18 LAB — BASIC METABOLIC PANEL
Anion gap: 8 (ref 5–15)
BUN: 5 mg/dL — AB (ref 6–20)
CALCIUM: 8 mg/dL — AB (ref 8.9–10.3)
CO2: 21 mmol/L — AB (ref 22–32)
CREATININE: 0.45 mg/dL (ref 0.44–1.00)
Chloride: 106 mmol/L (ref 101–111)
GFR calc non Af Amer: >60 mL/min (ref >60)
Glucose, Bld: 120 mg/dL — ABNORMAL HIGH (ref 65–99)
Potassium: 3.4 mmol/L — ABNORMAL LOW (ref 3.5–5.1)
SODIUM: 135 mmol/L (ref 135–145)

## 2017-01-18 LAB — CBC WITH DIFFERENTIAL/PLATELET
BASOS ABS: 0 10*3/uL (ref 0.0–0.1)
BASOS PCT: 0 %
EOS PCT: 1 %
Eosinophils Absolute: 0 10*3/uL (ref 0.0–0.7)
HCT: 26.7 % — ABNORMAL LOW (ref 36.0–46.0)
Hemoglobin: 9.1 g/dL — ABNORMAL LOW (ref 12.0–15.0)
Lymphocytes Relative: 56 %
Lymphs Abs: 3.7 10*3/uL (ref 0.7–4.0)
MCH: 27.4 pg (ref 26.0–34.0)
MCHC: 34.1 g/dL (ref 30.0–36.0)
MCV: 80.4 fL (ref 78.0–100.0)
MONO ABS: 0.3 10*3/uL (ref 0.1–1.0)
Monocytes Relative: 5 %
Neutro Abs: 2.5 10*3/uL (ref 1.7–7.7)
Neutrophils Relative %: 38 %
PLATELETS: 222 10*3/uL (ref 150–400)
RBC: 3.32 MIL/uL — ABNORMAL LOW (ref 3.87–5.11)
RDW: 13.3 % (ref 11.5–15.5)
WBC: 6.6 10*3/uL (ref 4.0–10.5)

## 2017-01-18 LAB — TROPONIN I

## 2017-01-18 LAB — PREGNANCY, URINE: Preg Test, Ur: NEGATIVE

## 2017-01-18 MED ORDER — IOPAMIDOL (ISOVUE-370) INJECTION 76%
100.0000 mL | Freq: Once | INTRAVENOUS | Status: AC | PRN
  Administered 2017-01-18: 100 mL via INTRAVENOUS

## 2017-01-18 MED ORDER — KETOROLAC TROMETHAMINE 30 MG/ML IJ SOLN
30.0000 mg | Freq: Once | INTRAMUSCULAR | Status: AC
  Administered 2017-01-18: 30 mg via INTRAVENOUS
  Filled 2017-01-18: qty 1

## 2017-01-18 NOTE — ED Notes (Signed)
Bilateral breath sounds clear.  No distress noted.

## 2017-01-18 NOTE — ED Provider Notes (Addendum)
[facility]   CSN: [id number] Arrival date & time: 01/18/17  0310     History   Chief Complaint Chief Complaint  Patient presents with  . Shortness of Breath    HPI Sheri Hodge is a 26 y.o. female.  The history is provided by the patient.  Shortness of Breath  This is a new problem. The average episode lasts 1 week. The problem occurs continuously.The current episode started more than 1 week ago. The problem has been gradually worsening. Associated symptoms include chest pain and rash. Pertinent negatives include no fever, no headaches, no coryza, no rhinorrhea, no sore throat, no swollen glands, no ear pain, no neck pain, no cough, no sputum production, no hemoptysis, no wheezing, no PND, no orthopnea, no vomiting, no abdominal pain, no leg pain, no leg swelling and no claudication. Precipitated by: surgery on neck  Risk factors: recent surgery on neck but was on lovenox until discharge on 3/12. She has tried nothing for the symptoms. The treatment provided no relief. She has had prior hospitalizations. She has had no prior ED visits. She has had no prior ICU admissions. Associated medical issues do not include PE.  [facility]  No neck pain.  No f/c/r.  No leg pain or swelling.  No pain with swallowing no change in voice. No drainage from site    Past Medical History:  Diagnosis Date  . Exophthalmia   . Goiter   . Graves disease   . Graves disease   . H/O goiter   . Hypertension   . Hyperthyroidism 2016  . Seizures (HCC)     There are no active problems to display for this patient.   Past Surgical History:  Procedure Laterality Date  . THYROIDECTOMY      OB History    No data available       Home Medications    Prior to Admission medications   Medication Sig Start Date End Date Taking? Authorizing Provider  diphenhydrAMINE (BENADRYL) 12.5 MG/5ML liquid Take 25 mg by mouth 4 (four) times  daily as needed.   Yes Historical Provider, MD  indomethacin (INDOCIN) 25 MG capsule Take 1 capsule (25 mg total) by mouth 2 (two) times daily with a meal. 02/24/16   Rolland PorterMark James, MD  methimazole (TAPAZOLE) 10 MG tablet Take 1 tablet (10 mg total) by mouth 3 (three) times daily. 09/08/15   Geoffery Lyonsouglas Delo, MD  metoprolol succinate (TOPROL-XL) 25 MG 24 hr tablet Take 3 tablets (75 mg total) by mouth daily. 09/24/16   Charlynne Panderavid Hsienta Yao, MD  ondansetron (ZOFRAN) 4 MG tablet Take 1 tablet (4 mg total) by mouth every 6 (six) hours. 11/12/16   Emi HolesAlexandra M Law, PA-C    Family History No family history on file.  Social History Social History  Substance Use Topics  . Smoking status: Light Tobacco Smoker    Packs/day: 0.25    Types: Cigarettes  . Smokeless tobacco: Never Used  . Alcohol use No     Allergies   Patient has no known allergies.   Review of Systems Review of Systems  Constitutional: Negative for fever.  HENT: Negative for ear pain, rhinorrhea and sore throat.   Respiratory: Positive for shortness of breath. Negative for cough, hemoptysis, sputum production, choking, chest tightness and wheezing.   Cardiovascular: Positive for chest pain. Negative for palpitations, orthopnea, claudication, leg swelling and PND.  Gastrointestinal: Negative for abdominal pain and  vomiting.  Musculoskeletal: Negative for neck pain.  Skin: Positive for rash.  Neurological: Negative for headaches.  All other systems reviewed and are negative.    Physical Exam Updated Vital Signs BP (!) 163/92 (BP Location: Left Arm)   Pulse 83   Temp 98.8 F (37.1 C) (Oral)   Resp 18   Ht 5\' 5"  (1.651 m)   Wt 164 lb (74.4 kg)   SpO2 100%   BMI 27.29 kg/m   Physical Exam  Constitutional: She is oriented to person, place, and time. She appears well-developed and well-nourished. No distress.  HENT:  Head: Normocephalic and atraumatic.  Mouth/Throat: No oropharyngeal exudate.  Eyes: Conjunctivae are normal.  Pupils are equal, round, and reactive to light.  exophthalmos  Neck: Trachea normal, normal range of motion and phonation normal. Neck supple. No JVD present. No tracheal tenderness present. No tracheal deviation present.  Incision CDI  Cardiovascular: Normal rate, regular rhythm, normal heart sounds and intact distal pulses.   Pulmonary/Chest: Effort normal and breath sounds normal. No stridor. She has no wheezes. She has no rales.  Abdominal: Soft. Bowel sounds are normal. She exhibits no mass. There is no tenderness. There is no rebound and no guarding.  Musculoskeletal: Normal range of motion. She exhibits no tenderness.  Lymphadenopathy:    She has no cervical adenopathy.  Neurological: She is alert and oriented to person, place, and time. She displays normal reflexes.  Skin: Skin is warm and dry. Capillary refill takes less than 2 seconds.  Fine discrete papules no hives  Psychiatric: She has a normal mood and affect.  Nursing note and vitals reviewed.    ED Treatments / Results   Vitals:   01/18/17 0316  BP: (!) 163/92  Pulse: 83  Resp: 18  Temp: 98.8 F (37.1 C)   Labs (all labs ordered are listed, but only abnormal results are displayed) Results for orders placed or performed during the hospital encounter of 01/18/17  CBC with Differential  Result Value Ref Range   WBC 6.6 4.0 - 10.5 K/uL   RBC 3.32 (L) 3.87 - 5.11 MIL/uL   Hemoglobin 9.1 (L) 12.0 - 15.0 g/dL   HCT 16.1 (L) 09.6 - 04.5 %   MCV 80.4 78.0 - 100.0 fL   MCH 27.4 26.0 - 34.0 pg   MCHC 34.1 30.0 - 36.0 g/dL   RDW 40.9 81.1 - 91.4 %   Platelets 222 150 - 400 K/uL   Neutrophils Relative % 38 %   Neutro Abs 2.5 1.7 - 7.7 K/uL   Lymphocytes Relative 56 %   Lymphs Abs 3.7 0.7 - 4.0 K/uL   Monocytes Relative 5 %   Monocytes Absolute 0.3 0.1 - 1.0 K/uL   Eosinophils Relative 1 %   Eosinophils Absolute 0.0 0.0 - 0.7 K/uL   Basophils Relative 0 %   Basophils Absolute 0.0 0.0 - 0.1 K/uL  Pregnancy, urine    Result Value Ref Range   Preg Test, Ur NEGATIVE NEGATIVE  Basic metabolic panel  Result Value Ref Range   Sodium 135 135 - 145 mmol/L   Potassium 3.4 (L) 3.5 - 5.1 mmol/L   Chloride 106 101 - 111 mmol/L   CO2 21 (L) 22 - 32 mmol/L   Glucose, Bld 120 (H) 65 - 99 mg/dL   BUN 5 (L) 6 - 20 mg/dL   Creatinine, Ser 7.82 0.44 - 1.00 mg/dL   Calcium 8.0 (L) 8.9 - 10.3 mg/dL   GFR calc non Af Amer >  60 >60 mL/min   GFR calc Af Amer >60 >60 mL/min   Anion gap 8 5 - 15  Troponin I  Result Value Ref Range   Troponin I <0.03 <0.03 ng/mL   Ct Angio Chest Pe W And/or Wo Contrast  Result Date: 01/18/2017 CLINICAL DATA:  Shortness of breath and chest pain. Thyroid removed on 01/11/2017. Rash since surgery. EXAM: CT ANGIOGRAPHY CHEST WITH CONTRAST TECHNIQUE: Multidetector CT imaging of the chest was performed using the standard protocol during bolus administration of intravenous contrast. Multiplanar CT image reconstructions and MIPs were obtained to evaluate the vascular anatomy. CONTRAST:  100 mL Isovue 370 COMPARISON:  None. FINDINGS: Cardiovascular: Somewhat poor contrast bolus, limiting evaluation of the distal segmental and subsegmental vessels. No central filling defects, suggesting no evidence of significant pulmonary embolus. Mild cardiac enlargement. No pericardial effusion. Normal caliber thoracic aorta. No dissection. Great vessel origins are patent. Mediastinum/Nodes: Surgical absence of the thyroid gland. Mild soft tissue infiltration and soft tissue gas in the thyroid bed is probably postoperative but infection is not excluded in the appropriate clinical setting. Esophagus is decompressed. No significant lymphadenopathy in the chest. Lungs/Pleura: Evaluation is limited due to motion artifact. Mild atelectasis in the lung bases, likely dependent. No focal airspace disease or consolidation. No pleural effusions. No pneumothorax. Airways are patent. Upper Abdomen: No acute abnormality.  Musculoskeletal: No chest wall abnormality. No acute or significant osseous findings. Review of the MIP images confirms the above findings. IMPRESSION: No evidence of significant central pulmonary embolus. Cardiac enlargement. No evidence of active pulmonary disease. Surgical absence of the thyroid gland with soft tissue infiltration and soft tissue gas in the thyroid bed. This is probably postoperative but infection is not excluded in the appropriate clinical setting. Electronically Signed   By: Burman Nieves M.D.   On: 01/18/2017 04:23     EKG  EKG Interpretation  Date/Time:  Friday January 18 2017 03:31:37 EDT Ventricular Rate:  78 PR Interval:    QRS Duration: 92 QT Interval:  399 QTC Calculation: 455 R Axis:   42 Text Interpretation:  Sinus rhythm RSR' in V1 or V2, probably normal variant Non-specific abnormality, ST segment, and/or T-wave unchanged Confirmed by Kurt G Vernon Md Pa  MD, Devi Hopman ([id number]) on 01/18/2017 3:44:57 AM       Radiology Ct Angio Chest Pe W And/or Wo Contrast  Result Date: 01/18/2017 CLINICAL DATA:  Shortness of breath and chest pain. Thyroid removed on 01/11/2017. Rash since surgery. EXAM: CT ANGIOGRAPHY CHEST WITH CONTRAST TECHNIQUE: Multidetector CT imaging of the chest was performed using the standard protocol during bolus administration of intravenous contrast. Multiplanar CT image reconstructions and MIPs were obtained to evaluate the vascular anatomy. CONTRAST:  100 mL Isovue 370 COMPARISON:  None. FINDINGS: Cardiovascular: Somewhat poor contrast bolus, limiting evaluation of the distal segmental and subsegmental vessels. No central filling defects, suggesting no evidence of significant pulmonary embolus. Mild cardiac enlargement. No pericardial effusion. Normal caliber thoracic aorta. No dissection. Great vessel origins are patent. Mediastinum/Nodes: Surgical absence of the thyroid gland. Mild soft tissue infiltration and soft tissue gas in the thyroid bed is probably  postoperative but infection is not excluded in the appropriate clinical setting. Esophagus is decompressed. No significant lymphadenopathy in the chest. Lungs/Pleura: Evaluation is limited due to motion artifact. Mild atelectasis in the lung bases, likely dependent. No focal airspace disease or consolidation. No pleural effusions. No pneumothorax. Airways are patent. Upper Abdomen: No acute abnormality. Musculoskeletal: No chest wall abnormality. No acute or significant osseous findings. Review  of the MIP images confirms the above findings. IMPRESSION: No evidence of significant central pulmonary embolus. Cardiac enlargement. No evidence of active pulmonary disease. Surgical absence of the thyroid gland with soft tissue infiltration and soft tissue gas in the thyroid bed. This is probably postoperative but infection is not excluded in the appropriate clinical setting. Electronically Signed   By: Burman Nieves M.D.   On: 01/18/2017 04:23    Procedures Procedures (including critical care time)  Medications Ordered in ED Medications  iopamidol (ISOVUE-370) 76 % injection 100 mL (100 mLs Intravenous Contrast Given 01/18/17 0402)  ketorolac (TORADOL) 30 MG/ML injection 30 mg (30 mg Intravenous Given 01/18/17 0415)     Case d/w Dr. Jaquita Rector, of ENT at [facility] who knows the patient.  Patient had a drain in place and gas and inflammation is to be expected if no drainage or fevers there is no indication for antibiotics.  Follow up in the office.   Final Clinical Impressions(s) / ED Diagnoses  There is no PE.  Troponin is negative in the setting of ongoing pain > 8 hours duration.  Patient has ruled out for MI and PE in the ED>  She was informed of cardiac enlargement and need to follow up as an outpatient with cardiology related to same.  She has an appointment this am with her doctors.  Continued all medications as directed by your doctors.    Patient is well appearing, normal vital signs. Based on history  and exam patient has been appropriately medically screened and emergency conditions excluded. Patient is stable for discharge at this time. Strict return precautions given for fever, weakness, numbness, lethargy, intractable vomiting or diarrhea, shortness of breath, chest pain, worsening symptomsor anyfurther problems or concerns. Follow up with your PMD in 2 days for recheck.  New Prescriptions New Prescriptions   No medications on file     Gyan Cambre, MD 01/18/17 [facility], MD 01/18/17 0503    Pietra Zuluaga, MD 01/18/17 [id number]

## 2017-01-18 NOTE — ED Notes (Signed)
[facility] @ [phone] for ENT Consult (Dr. Karel Jarvislelie Melancon)

## 2017-01-18 NOTE — ED Triage Notes (Signed)
Pt reports shob and chest pain. Pt had thyroid removed 01/11/17. Pt also has rash that she has had since surgery which has not improved.

## 2017-02-04 DIAGNOSIS — E890 Postprocedural hypothyroidism: Secondary | ICD-10-CM

## 2017-02-04 HISTORY — DX: Postprocedural hypothyroidism: E89.0

## 2017-04-17 IMAGING — CT CT ANGIO CHEST
2 of 8 series · 18 of 36 positions shown · IV contrast (isovue)
Comparison: None.

CLINICAL DATA: Shortness of breath and chest pain. Thyroid removed
on 01/11/2017. Rash since surgery.

EXAM:
CT ANGIOGRAPHY CHEST WITH CONTRAST
TECHNIQUE: Multidetector CT imaging of the chest was performed using the
standard protocol during bolus administration of intravenous
contrast. Multiplanar CT image reconstructions and MIPs were
obtained to evaluate the vascular anatomy.
CONTRAST:  100 mL Isovue 370

[Series 6: pe coronal mpr · coronal · 0.50mm/px · 1 of 123 slices shown]
[im 62/123  mediastinal]
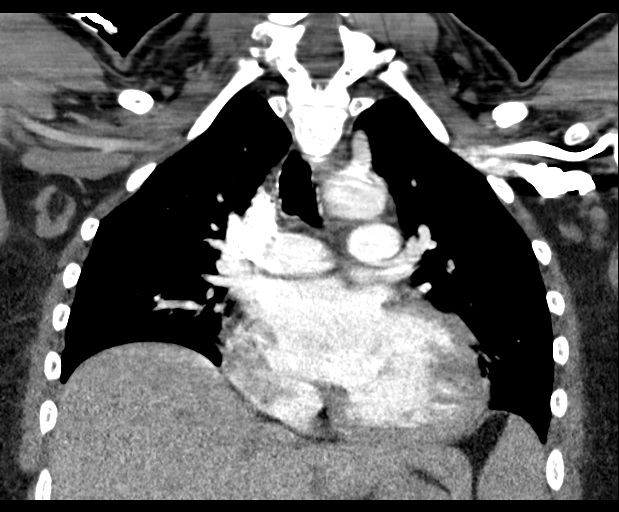

[Series 10: pe thins · axial · 0.67mm/px · z∈[+1118,+1336]mm · 17 of 243 slices shown]
[im 13/243  lung]
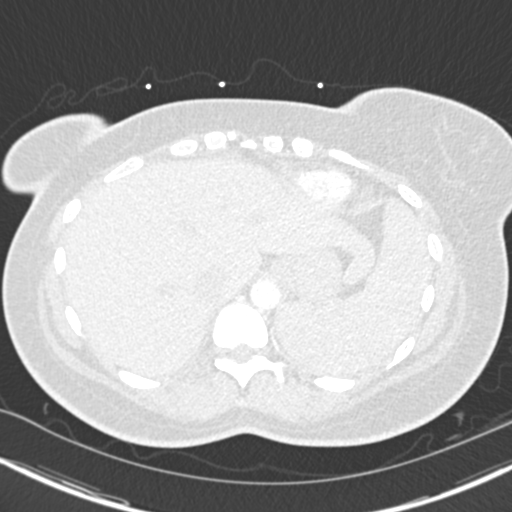
[im 26/243  mediastinal]
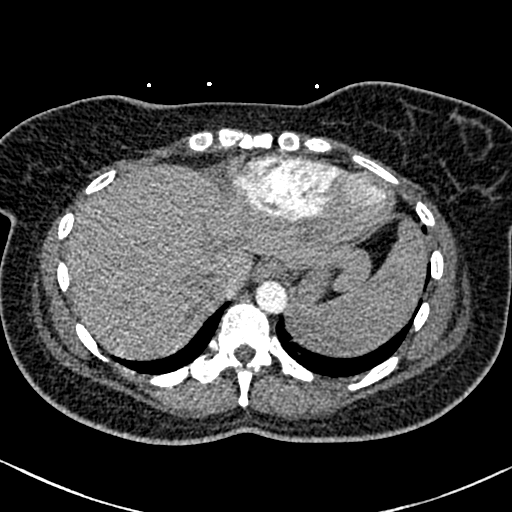
[im 39/243  lung]
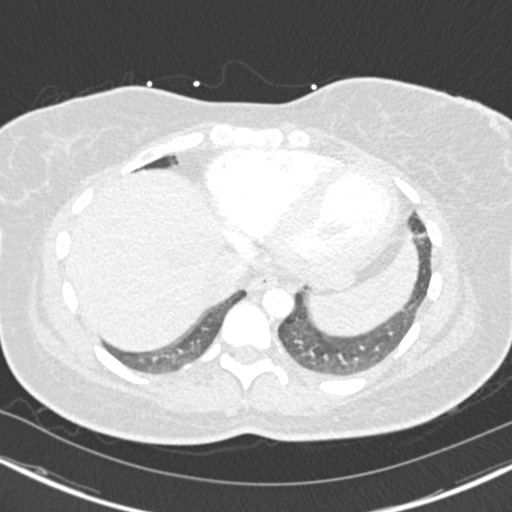
[im 51/243  mediastinal]
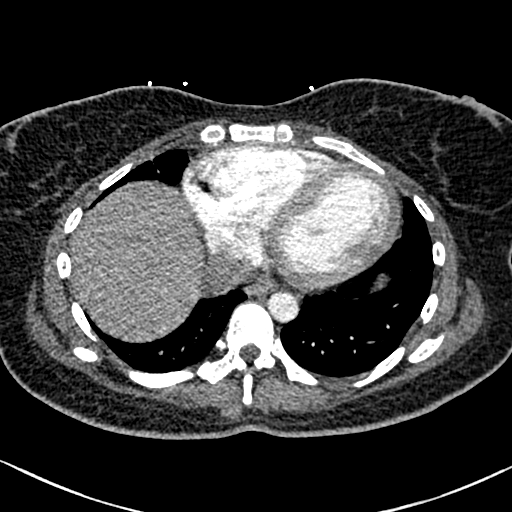
[im 64/243  lung]
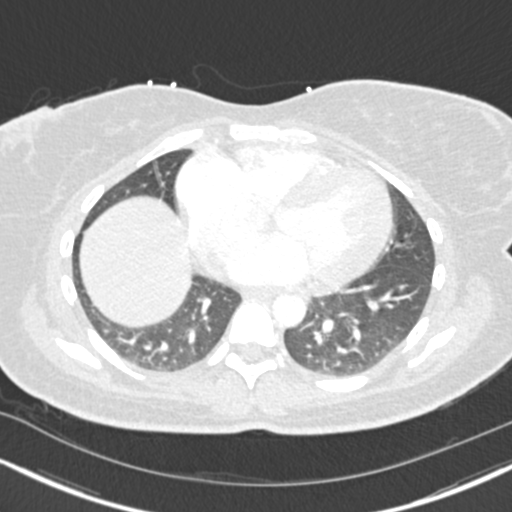
[im 77/243  mediastinal]
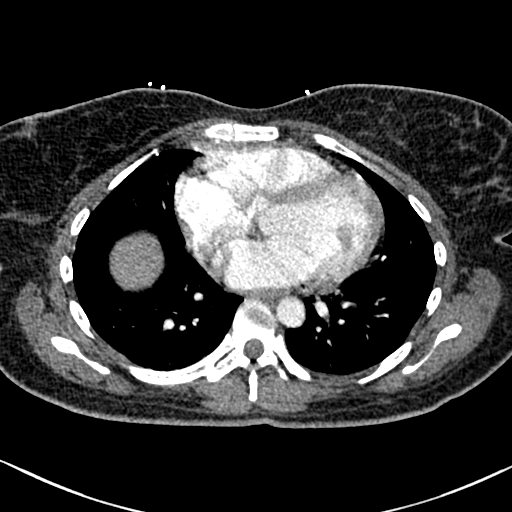
[im 90/243  lung]
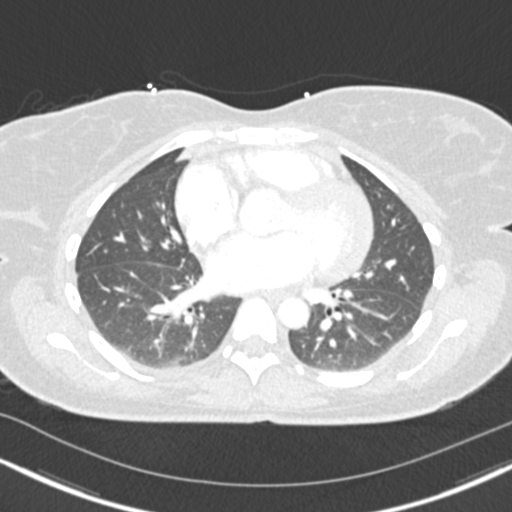
[im 102/243  mediastinal]
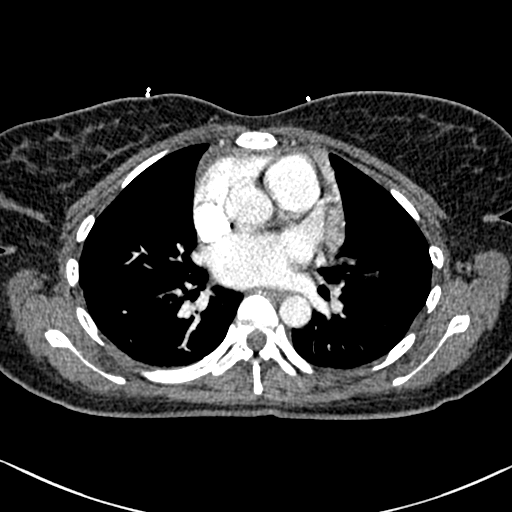
[im 128/243  lung]
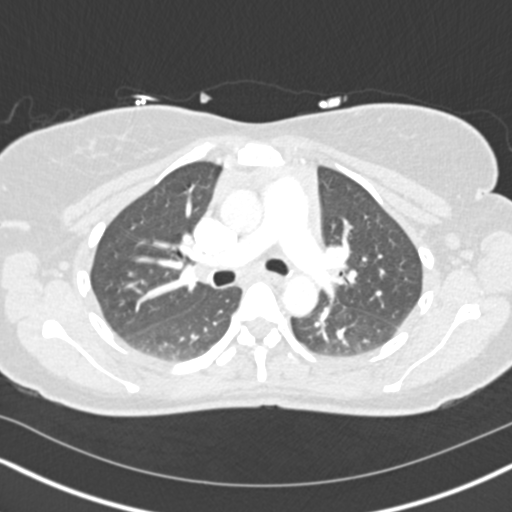
[im 141/243  mediastinal]
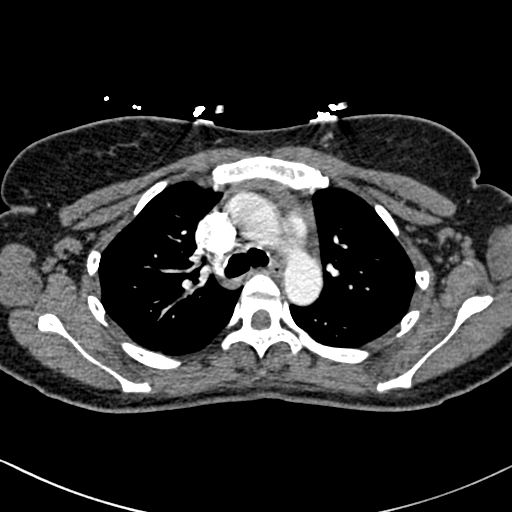
[im 153/243  lung]
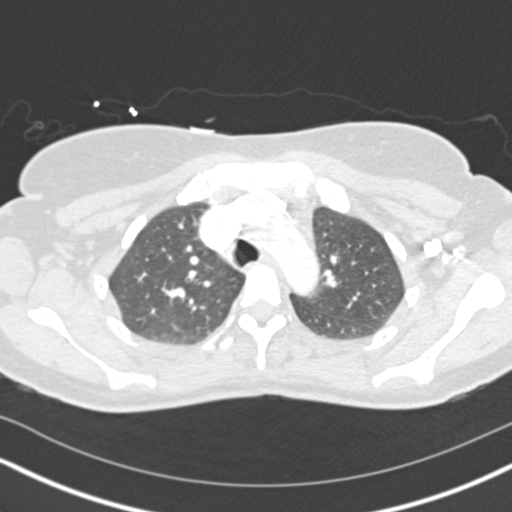
[im 166/243  mediastinal]
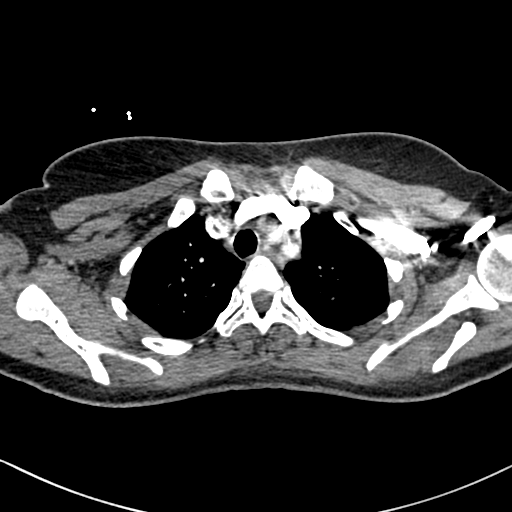
[im 179/243  lung]
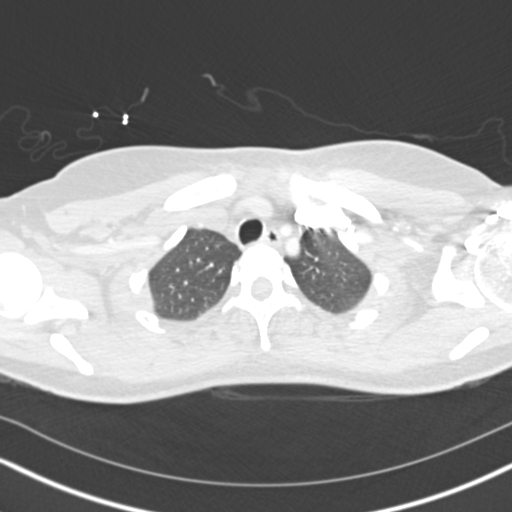
[im 192/243  mediastinal]
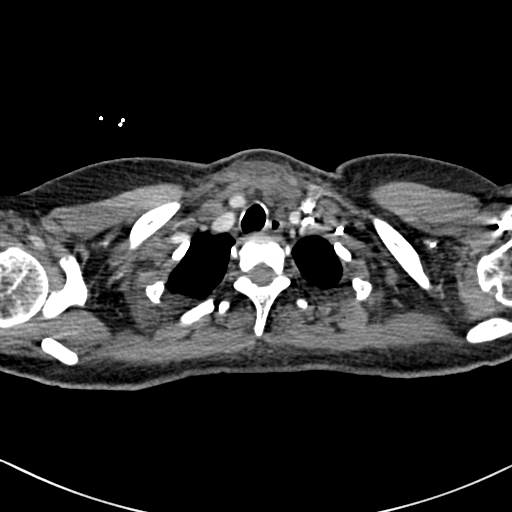
[im 204/243  lung]
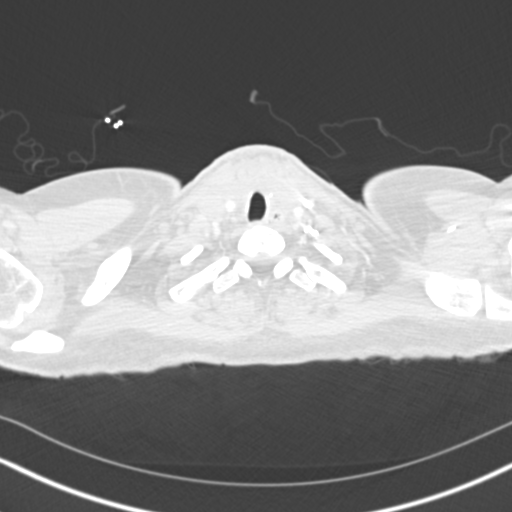
[im 217/243  mediastinal]
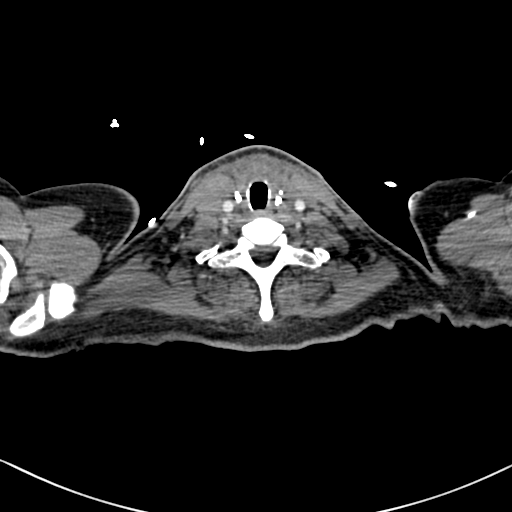
[im 230/243  lung]
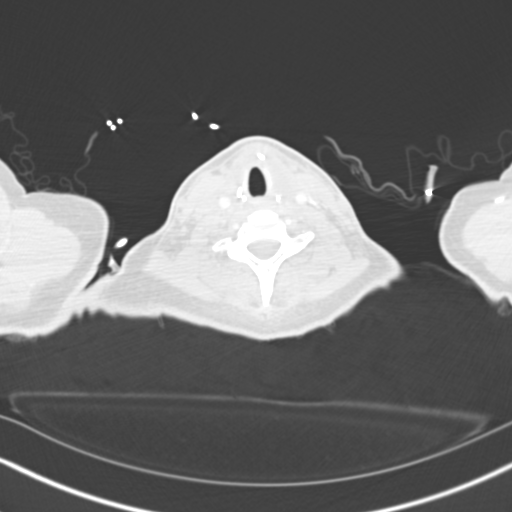

[18 of 36 positions shown; findings below may reference images not displayed]

FINDINGS: Cardiovascular: Somewhat poor contrast bolus, limiting evaluation of
the distal segmental and subsegmental vessels. No central filling
defects, suggesting no evidence of significant pulmonary embolus.
Mild cardiac enlargement. No pericardial effusion. Normal caliber
thoracic aorta. No dissection. Great vessel origins are patent.

Mediastinum/Nodes: Surgical absence of the thyroid gland. Mild soft
tissue infiltration and soft tissue gas in the thyroid bed is
probably postoperative but infection is not excluded in the
appropriate clinical setting. Esophagus is decompressed. No
significant lymphadenopathy in the chest.

Lungs/Pleura: Evaluation is limited due to motion artifact. Mild
atelectasis in the lung bases, likely dependent. No focal airspace
disease or consolidation. No pleural effusions. No pneumothorax.
Airways are patent.

Upper Abdomen: No acute abnormality.

Musculoskeletal: No chest wall abnormality. No acute or significant
osseous findings.

Review of the MIP images confirms the above findings.
IMPRESSION: No evidence of significant central pulmonary embolus. Cardiac
enlargement. No evidence of active pulmonary disease. Surgical
absence of the thyroid gland with soft tissue infiltration and soft
tissue gas in the thyroid bed. This is probably postoperative but
infection is not excluded in the appropriate clinical setting.

## 2017-07-01 ENCOUNTER — Emergency Department (HOSPITAL_BASED_OUTPATIENT_CLINIC_OR_DEPARTMENT_OTHER)
Admission: EM | Admit: 2017-07-01 | Discharge: 2017-07-01 | Disposition: A | Discharge status: Home / Self Care | Payer: 59 | Attending: Emergency Medicine | Admitting: Emergency Medicine

## 2017-07-01 ENCOUNTER — Encounter (HOSPITAL_BASED_OUTPATIENT_CLINIC_OR_DEPARTMENT_OTHER): Payer: Self-pay | Admitting: Emergency Medicine

## 2017-07-01 DIAGNOSIS — M25551 Pain in right hip: Secondary | ICD-10-CM | POA: Insufficient documentation

## 2017-07-01 DIAGNOSIS — F17210 Nicotine dependence, cigarettes, uncomplicated: Secondary | ICD-10-CM | POA: Insufficient documentation

## 2017-07-01 DIAGNOSIS — W57XXXA Bitten or stung by nonvenomous insect and other nonvenomous arthropods, initial encounter: Secondary | ICD-10-CM | POA: Insufficient documentation

## 2017-07-01 DIAGNOSIS — I10 Essential (primary) hypertension: Secondary | ICD-10-CM | POA: Insufficient documentation

## 2017-07-01 DIAGNOSIS — L089 Local infection of the skin and subcutaneous tissue, unspecified: Secondary | ICD-10-CM

## 2017-07-01 DIAGNOSIS — S80861A Insect bite (nonvenomous), right lower leg, initial encounter: Secondary | ICD-10-CM

## 2017-07-01 LAB — PREGNANCY, URINE: Preg Test, Ur: NEGATIVE

## 2017-07-01 MED ORDER — DOXYCYCLINE HYCLATE 100 MG PO TABS
100.0000 mg | ORAL_TABLET | Freq: Once | ORAL | Status: AC
  Administered 2017-07-01: 100 mg via ORAL
  Filled 2017-07-01: qty 1

## 2017-07-01 MED ORDER — DOXYCYCLINE HYCLATE 100 MG PO CAPS: 100.0000 mg | ORAL_CAPSULE | Freq: Two times a day (BID) | ORAL | 0 refills | Status: DC

## 2017-07-01 MED ORDER — NAPROXEN 250 MG PO TABS
500.0000 mg | ORAL_TABLET | Freq: Once | ORAL | Status: AC
  Administered 2017-07-01: 500 mg via ORAL
  Filled 2017-07-01: qty 2

## 2017-07-01 NOTE — ED Triage Notes (Signed)
Pt states she felt insect bite yesterday toL hip/upper thigh  and today has developed erythema and edema to area. Denies fever or other sx.

## 2017-07-01 NOTE — ED Provider Notes (Addendum)
[facility] Provider Note: Lowella Dell, MD, FACEP  CSN: [id number] MRN: [id number] ARRIVAL: 07/01/17 at 0247 ROOM: [facility]01/[facility]01   CHIEF COMPLAINT  Cellulitis   HISTORY OF PRESENT ILLNESS  07/01/17 3:49 AM Sheri Hodge is a 26 y.o. female who believes she was bitten or stung by an unseen insect or spider on her right hip yesterday. Since then she has developed pain, erythema and tenderness surrounding the area. She rates her pain as a 7 out of 10, worse with movement or palpation. She has had chills but no fever. She denies nausea, vomiting or diarrhea. There is associated itching at the site.   Past Medical History:  Diagnosis Date  . Exophthalmia   . Goiter   . Graves disease   . Graves disease   . H/O goiter   . Hypertension   . Hyperthyroidism 2016  . Seizures (HCC)     Past Surgical History:  Procedure Laterality Date  . THYROIDECTOMY      No family history on file.  Social History  Substance Use Topics  . Smoking status: Light Tobacco Smoker    Packs/day: 0.25    Types: Cigarettes  . Smokeless tobacco: Never Used  . Alcohol use No    Prior to Admission medications   Medication Sig Start Date End Date Taking? Authorizing Provider  doxycycline (VIBRAMYCIN) 100 MG capsule Take 1 capsule (100 mg total) by mouth 2 (two) times daily. One po bid x 7 days 07/01/17   Kiyonna Tortorelli, Jonny Ruiz, MD    Allergies Patient has no known allergies.   REVIEW OF SYSTEMS  Negative except as noted here or in the History of Present Illness.   PHYSICAL EXAMINATION  Initial Vital Signs Blood pressure 122/70, pulse (!) 59, temperature 98.9 F (37.2 C), temperature source Oral, resp. rate 20, height 5\' 5"  (1.651 m), weight 83.5 kg (184 lb), last menstrual period 05/26/2017, SpO2 99 %.  Examination General: Well-developed, well-nourished female in no acute distress; appearance consistent with age of record HENT: normocephalic; atraumatic Eyes: pupils equal, round and  reactive to light; extraocular muscles intact Neck: supple Heart: regular rate and rhythm Lungs: clear to auscultation bilaterally Abdomen: soft; nondistended; nontender; bowel sounds present Extremities: No deformity; full range of motion; pulses normal Neurologic: Awake, alert and oriented; motor function intact in all extremities and symmetric; no facial droop Skin: Warm and dry; erythema, tenderness and warmth of right hip area without fluctuance or mass:   Psychiatric: Normal mood and affect   RESULTS  Summary of this visit's results, reviewed by myself:   EKG Interpretation  Date/Time:    Ventricular Rate:    PR Interval:    QRS Duration:   QT Interval:    QTC Calculation:   R Axis:     Text Interpretation:        Laboratory Studies: Results for orders placed or performed during the hospital encounter of 07/01/17 (from the past 24 hour(s))  Pregnancy, urine     Status: None   Collection Time: 07/01/17  3:02 AM  Result Value Ref Range   Preg Test, Ur NEGATIVE NEGATIVE   Imaging Studies: No results found.  ED COURSE  Nursing notes and initial vitals signs, including pulse oximetry, reviewed.  Vitals:   07/01/17 0252 07/01/17 0404  BP:  122/70  Pulse:  (!) 59  Resp:  20  Temp:  98.9 F (37.2 C)  TempSrc:  Oral  SpO2:  99%  Weight: 83.5 kg (184 lb)   Height:  5\' 5"  (1.651 m)     PROCEDURES    ED DIAGNOSES     ICD-10-CM   1. Infected insect bite of right lower extremity, initial encounter S80.861A    L08.9    W57.Neldon Newport, MD 07/01/17 5701    Paula Libra, MD 07/01/17 7793

## 2017-07-20 DIAGNOSIS — Z91199 Patient's noncompliance with other medical treatment and regimen due to unspecified reason: Secondary | ICD-10-CM

## 2017-07-20 HISTORY — DX: Patient's noncompliance with other medical treatment and regimen due to unspecified reason: Z91.199

## 2017-09-19 ENCOUNTER — Inpatient Hospital Stay (HOSPITAL_BASED_OUTPATIENT_CLINIC_OR_DEPARTMENT_OTHER)
Admission: EM | Admit: 2017-09-19 | Discharge: 2017-09-22 | DRG: 645 | Disposition: A | Discharge status: Home / Self Care | Payer: Self-pay | Attending: Internal Medicine | Admitting: Internal Medicine

## 2017-09-19 ENCOUNTER — Other Ambulatory Visit: Payer: Self-pay

## 2017-09-19 ENCOUNTER — Encounter (HOSPITAL_BASED_OUTPATIENT_CLINIC_OR_DEPARTMENT_OTHER): Payer: Self-pay | Admitting: Emergency Medicine

## 2017-09-19 ENCOUNTER — Emergency Department (HOSPITAL_BASED_OUTPATIENT_CLINIC_OR_DEPARTMENT_OTHER): Payer: Self-pay

## 2017-09-19 DIAGNOSIS — A599 Trichomoniasis, unspecified: Secondary | ICD-10-CM | POA: Diagnosis present

## 2017-09-19 DIAGNOSIS — F17210 Nicotine dependence, cigarettes, uncomplicated: Secondary | ICD-10-CM | POA: Diagnosis present

## 2017-09-19 DIAGNOSIS — E890 Postprocedural hypothyroidism: Principal | ICD-10-CM | POA: Diagnosis present

## 2017-09-19 DIAGNOSIS — D649 Anemia, unspecified: Secondary | ICD-10-CM | POA: Diagnosis present

## 2017-09-19 DIAGNOSIS — R001 Bradycardia, unspecified: Secondary | ICD-10-CM | POA: Diagnosis present

## 2017-09-19 DIAGNOSIS — R7989 Other specified abnormal findings of blood chemistry: Secondary | ICD-10-CM

## 2017-09-19 DIAGNOSIS — E876 Hypokalemia: Secondary | ICD-10-CM | POA: Diagnosis present

## 2017-09-19 DIAGNOSIS — Z833 Family history of diabetes mellitus: Secondary | ICD-10-CM

## 2017-09-19 DIAGNOSIS — E039 Hypothyroidism, unspecified: Secondary | ICD-10-CM | POA: Diagnosis present

## 2017-09-19 DIAGNOSIS — K5900 Constipation, unspecified: Secondary | ICD-10-CM | POA: Diagnosis present

## 2017-09-19 DIAGNOSIS — D509 Iron deficiency anemia, unspecified: Secondary | ICD-10-CM | POA: Diagnosis present

## 2017-09-19 DIAGNOSIS — E538 Deficiency of other specified B group vitamins: Secondary | ICD-10-CM | POA: Diagnosis present

## 2017-09-19 DIAGNOSIS — I10 Essential (primary) hypertension: Secondary | ICD-10-CM | POA: Diagnosis present

## 2017-09-19 DIAGNOSIS — Z79890 Hormone replacement therapy (postmenopausal): Secondary | ICD-10-CM

## 2017-09-19 LAB — COMPREHENSIVE METABOLIC PANEL
ALBUMIN: 4.5 g/dL (ref 3.5–5.0)
ALT: 46 U/L (ref 14–54)
AST: 46 U/L — AB (ref 15–41)
Alkaline Phosphatase: 93 U/L (ref 38–126)
Anion gap: 6 (ref 5–15)
BILIRUBIN TOTAL: 0.3 mg/dL (ref 0.3–1.2)
BUN: 13 mg/dL (ref 6–20)
CO2: 26 mmol/L (ref 22–32)
Calcium: 8.4 mg/dL — ABNORMAL LOW (ref 8.9–10.3)
Chloride: 104 mmol/L (ref 101–111)
Creatinine, Ser: 0.92 mg/dL (ref 0.44–1.00)
GFR calc Af Amer: >60 mL/min (ref >60)
GFR calc non Af Amer: >60 mL/min (ref >60)
GLUCOSE: 83 mg/dL (ref 65–99)
POTASSIUM: 3.5 mmol/L (ref 3.5–5.1)
Sodium: 136 mmol/L (ref 135–145)
Total Protein: 8 g/dL (ref 6.5–8.1)

## 2017-09-19 LAB — URINALYSIS, ROUTINE W REFLEX MICROSCOPIC
BILIRUBIN URINE: NEGATIVE
GLUCOSE, UA: NEGATIVE mg/dL
HGB URINE DIPSTICK: NEGATIVE
KETONES UR: 15 mg/dL — AB
Leukocytes, UA: NEGATIVE
Nitrite: NEGATIVE
PROTEIN: 30 mg/dL — AB
Specific Gravity, Urine: 1.025 (ref 1.005–1.030)
pH: 6.5 (ref 5.0–8.0)

## 2017-09-19 LAB — CBC WITH DIFFERENTIAL/PLATELET
BASOS ABS: 0 10*3/uL (ref 0.0–0.1)
BASOS PCT: 0 %
Eosinophils Absolute: 0.1 10*3/uL (ref 0.0–0.7)
Eosinophils Relative: 2 %
HEMATOCRIT: 33.2 % — AB (ref 36.0–46.0)
Hemoglobin: 11.3 g/dL — ABNORMAL LOW (ref 12.0–15.0)
Lymphocytes Relative: 72 %
Lymphs Abs: 5.6 10*3/uL — ABNORMAL HIGH (ref 0.7–4.0)
MCH: 30.9 pg (ref 26.0–34.0)
MCHC: 34 g/dL (ref 30.0–36.0)
MCV: 90.7 fL (ref 78.0–100.0)
MONO ABS: 0.2 10*3/uL (ref 0.1–1.0)
Monocytes Relative: 2 %
NEUTROS ABS: 1.8 10*3/uL (ref 1.7–7.7)
NEUTROS PCT: 24 %
Platelets: 178 10*3/uL (ref 150–400)
RBC: 3.66 MIL/uL — ABNORMAL LOW (ref 3.87–5.11)
RDW: 14 % (ref 11.5–15.5)
WBC: 7.7 10*3/uL (ref 4.0–10.5)

## 2017-09-19 LAB — URINALYSIS, MICROSCOPIC (REFLEX)

## 2017-09-19 LAB — LIPASE, BLOOD: Lipase: 33 U/L (ref 11–51)

## 2017-09-19 LAB — PREGNANCY, URINE: Preg Test, Ur: NEGATIVE

## 2017-09-19 LAB — PROTIME-INR
INR: 0.94
Prothrombin Time: 12.5 seconds (ref 11.4–15.2)

## 2017-09-19 LAB — T4, FREE: FREE T4: 0.27 ng/dL — AB (ref 0.61–1.12)

## 2017-09-19 LAB — TSH: TSH: 60.308 u[IU]/mL — AB (ref 0.350–4.500)

## 2017-09-19 LAB — MAGNESIUM: MAGNESIUM: 2.2 mg/dL (ref 1.7–2.4)

## 2017-09-19 NOTE — ED Provider Notes (Signed)
[facility] EMERGENCY DEPARTMENT Provider Note   CSN: [id number] Arrival date & time: 09/19/17  1643     History   Chief Complaint Chief Complaint  Patient presents with  . Altered Mental Status    HPI Sheri Hodge is a 26 y.o. female.  The history is provided by the patient and medical records. No language interpreter was used.  Illness  This is a new problem. The current episode started more than 1 week ago. The problem occurs constantly. The problem has not changed since onset.Associated symptoms include shortness of breath (resolved). Pertinent negatives include no chest pain, no abdominal pain and no headaches. Nothing aggravates the symptoms. Nothing relieves the symptoms. She has tried nothing for the symptoms. The treatment provided no relief.  Constipation   This is a new problem. The current episode started more than 2 days ago. Pertinent negatives include no abdominal pain and no dysuria. She has tried nothing for the symptoms. The treatment provided no relief. Risk factors include a change in medication usage/dosage (out of synthroid). Her past medical history is significant for endocrine disease.    Past Medical History:  Diagnosis Date  . Exophthalmia   . Goiter   . Graves disease   . Graves disease   . H/O goiter   . Hypertension   . Hyperthyroidism 2016  . Seizures (HCC)     There are no active problems to display for this patient.   Past Surgical History:  Procedure Laterality Date  . THYROIDECTOMY      OB History    No data available       Home Medications    Prior to Admission medications   Medication Sig Start Date End Date Taking? Authorizing Provider  doxycycline (VIBRAMYCIN) 100 MG capsule Take 1 capsule (100 mg total) by mouth 2 (two) times daily. One po bid x 7 days 07/01/17   Molpus, Jonny RuizJohn, MD    Family History History reviewed. No pertinent family history.  Social History Social History   Tobacco Use  . Smoking  status: Light Tobacco Smoker    Packs/day: 0.25    Types: Cigarettes  . Smokeless tobacco: Never Used  Substance Use Topics  . Alcohol use: No  . Drug use: No     Allergies   Patient has no known allergies.   Review of Systems Review of Systems  Constitutional: Positive for appetite change and fatigue. Negative for chills, diaphoresis and fever (decreased).  HENT: Negative for congestion.   Eyes: Negative for photophobia and visual disturbance.  Respiratory: Positive for shortness of breath (resolved). Negative for cough, chest tightness, wheezing and stridor.   Cardiovascular: Negative for chest pain, palpitations and leg swelling.  Gastrointestinal: Positive for constipation. Negative for abdominal pain, diarrhea, nausea and vomiting.  Genitourinary: Positive for decreased urine volume. Negative for dysuria and frequency.  Musculoskeletal: Negative for back pain, neck pain and neck stiffness ( ).  Neurological: Negative for weakness, light-headedness, numbness and headaches.  Psychiatric/Behavioral: Positive for confusion. Negative for agitation.  All other systems reviewed and are negative.    Physical Exam Updated Vital Signs BP 123/82 (BP Location: Left Arm)   Pulse 67   Temp 98.2 F (36.8 C) (Oral)   Resp 18   Ht 5\' 5"  (1.651 m)   Wt 83.5 kg (184 lb)   SpO2 100%   BMI 30.62 kg/m   Physical Exam  Constitutional: She is oriented to person, place, and time. She appears well-developed and well-nourished. No distress.  HENT:  Head: Normocephalic.  Mouth/Throat: Oropharynx is clear and moist. No oropharyngeal exudate.  Eyes: Conjunctivae and EOM are normal. Pupils are equal, round, and reactive to light.  Neck: Normal range of motion. No JVD present.  Cardiovascular: Normal rate and intact distal pulses.  No murmur heard. Pulmonary/Chest: No respiratory distress. She has no wheezes. She has no rales. She exhibits no tenderness.  Abdominal: Soft. There is no  tenderness. There is no guarding.  Musculoskeletal: She exhibits no edema or tenderness.  Neurological: She is alert and oriented to person, place, and time. No cranial nerve deficit or sensory deficit. She exhibits normal muscle tone.  Skin: Capillary refill takes less than 2 seconds. No rash noted. She is not diaphoretic. No erythema.  Psychiatric: She has a normal mood and affect.  Nursing note and vitals reviewed.    ED Treatments / Results  Labs (all labs ordered are listed, but only abnormal results are displayed) Labs Reviewed  TSH - Abnormal; Notable for the following components:      Result Value   TSH 60.308 (*)    All other components within normal limits  T4, FREE - Abnormal; Notable for the following components:   Free T4 0.27 (*)    All other components within normal limits  CBC WITH DIFFERENTIAL/PLATELET - Abnormal; Notable for the following components:   RBC 3.66 (*)    Hemoglobin 11.3 (*)    HCT 33.2 (*)    Lymphs Abs 5.6 (*)    All other components within normal limits  COMPREHENSIVE METABOLIC PANEL - Abnormal; Notable for the following components:   Calcium 8.4 (*)    AST 46 (*)    All other components within normal limits  URINALYSIS, ROUTINE W REFLEX MICROSCOPIC - Abnormal; Notable for the following components:   APPearance CLOUDY (*)    Ketones, ur 15 (*)    Protein, ur 30 (*)    All other components within normal limits  URINALYSIS, MICROSCOPIC (REFLEX) - Abnormal; Notable for the following components:   Bacteria, UA MANY (*)    Squamous Epithelial / LPF 6-30 (*)    All other components within normal limits  URINE CULTURE  LIPASE, BLOOD  PROTIME-INR  PREGNANCY, URINE  MAGNESIUM  T3, FREE    EKG  EKG Interpretation  Date/Time:  Thursday September 19 2017 19:54:13 EST Ventricular Rate:  53 PR Interval:    QRS Duration: 99 QT Interval:  453 QTC Calculation: 426 R Axis:   109 Text Interpretation:  Sinus rhythm Borderline right axis deviation  Borderline T wave abnormalities When comapred to prior, possible decreased amplitude.  No STEMI Confirmed by Theda Belfast ([id number]) on 09/19/2017 10:52:40 PM       Radiology Dg Chest 2 View  Result Date: 09/19/2017 CLINICAL DATA:  Confusion. Constipation. Disoriented. Status post thyroidectomy, out of medication. EXAM: CHEST  2 VIEW COMPARISON:  CT chest January 18, 2017 FINDINGS: Cardiac silhouette is mildly enlarged and unchanged. Mediastinal silhouette is nonsuspicious. No pleural effusion or focal consolidation. No pneumothorax. Soft tissue planes included osseous structures are normal. IMPRESSION: Stable cardiomegaly.  No acute pulmonary process. Electronically Signed   By: Awilda Metro M.D.   On: 09/19/2017 19:32    Procedures Procedures (including critical care time)  Medications Ordered in ED Medications  levothyroxine (SYNTHROID, LEVOTHROID) injection 50 mcg (not administered)     Initial Impression / Assessment and Plan / ED Course  I have reviewed the triage vital signs and the nursing notes.  Pertinent  labs & imaging results that were available during my care of the patient were reviewed by me and considered in my medical decision making (see chart for details).     Sheri HoldingJamie Vital is a 26 y.o. female with a past medical history significant for hyperthyroidism status post thyroid removal and subsequent hypothyroidism currently not taking her Synthroid who presents with multiple complaints including fatigue, intermittent confusion, chills, decreased urination, constipation, missed menstrual cycle, and shortness of breath.  Patient reports that she has not been able to afford her Synthroid prescription this month and has not taken it.  She says that for the last month she has been having severe chills and sleeping in [place] is in multiple layers.  She says that she has been very fatigued and tired.  She says that with parts of her teeth have been chipping off.  She reports  decreased appetite and has not been drinking much fluid.  She reports decreased urination.  She reports generalized aching and severe fatigue.  She reports some shortness of breath but denies any chest pain or cough.  She is concerned that her thyroid may be "all messed up".  Patient says that she has not had her menstrual cycle this month and has unprotected intercourse without birth control.  She is unsure if she is pregnant.  On exam, patient is wearing numerous layers of winter clothes despite being inside.  Patient has a dry mouth on exam.  Patient's lungs are clear to auscultation and abdomen is nontender.  Chest nontender.  No significant edema seen in the legs.  Patient has normal sensation and strength in all extremities.  No facial droop.  Patient was alert and oriented on my exam.  No focal neurologic deficits were seen.  Based on patient's description of symptoms and admission that she has not been taking any of her thyroid medicine, I suspect she is hypothyroid at this time.  Patient will have workup to look for pregnancy, UTI, pneumonia, or significant dehydration with electrolyte abnormality given her reported symptoms over the last few weeks.  Anticipate reassessment after laboratory testing returns.  Diagnostic testing results seen above.  Patient found to have very low free T4 and a very high TSH at 60.3.  Other laboratory testing showed no evidence of acute infection in the lungs or urine.  EKG was slightly bradycardic.  I suspect severe hypothyroidism is the cause of the patient's constellation of symptoms.  Patient will be given levothyroxine intravenously and admitted for further management of hypothyroidism.  Given patient's symptoms and the report of intermittent confusion, do not feel patient is safe for discharge home given her hypothyroidism.  Patient will be admitted to hospitalist service.    Final Clinical Impressions(s) / ED Diagnoses   Final diagnoses:    Hypothyroidism, unspecified type     Clinical Impression: 1. Hypothyroidism, unspecified type     Disposition: Admit to hospitalist service    Trana Ressler, Canary Brimhristopher J, MD 09/20/17 [phone]

## 2017-09-19 NOTE — ED Triage Notes (Addendum)
Patient states that she has had had some confusion and feels like she can not concentrate. She also feels like she is having problems with her thyroid. The patient reports that she has not had an BM since last week. PAtient states that she has felt this disaorientation for about a month since she ran out of her medications. The patient has an RX but does not have the money to get it filled

## 2017-09-19 NOTE — ED Notes (Signed)
Patient states she does not have the funds to obtain her Levothyroxine 125 mcg and has not taken it for one month.  [facility] for $9.00 with an ED script.  Also, [facility], there may be more assistance with her medications there.  Thanks.

## 2017-09-20 ENCOUNTER — Encounter (HOSPITAL_COMMUNITY): Payer: Self-pay | Admitting: Internal Medicine

## 2017-09-20 ENCOUNTER — Other Ambulatory Visit: Payer: Self-pay

## 2017-09-20 DIAGNOSIS — E039 Hypothyroidism, unspecified: Secondary | ICD-10-CM | POA: Diagnosis present

## 2017-09-20 DIAGNOSIS — E876 Hypokalemia: Secondary | ICD-10-CM

## 2017-09-20 DIAGNOSIS — A599 Trichomoniasis, unspecified: Secondary | ICD-10-CM

## 2017-09-20 DIAGNOSIS — D649 Anemia, unspecified: Secondary | ICD-10-CM | POA: Diagnosis present

## 2017-09-20 DIAGNOSIS — D508 Other iron deficiency anemias: Secondary | ICD-10-CM

## 2017-09-20 HISTORY — DX: Hypokalemia: E87.6

## 2017-09-20 HISTORY — DX: Other iron deficiency anemias: D50.8

## 2017-09-20 HISTORY — DX: Hypothyroidism, unspecified: E03.9

## 2017-09-20 HISTORY — DX: Trichomoniasis, unspecified: A59.9

## 2017-09-20 HISTORY — DX: Anemia, unspecified: D64.9

## 2017-09-20 LAB — MAGNESIUM: Magnesium: 2 mg/dL (ref 1.7–2.4)

## 2017-09-20 LAB — CBC
HCT: 33.1 % — ABNORMAL LOW (ref 36.0–46.0)
Hemoglobin: 11.4 g/dL — ABNORMAL LOW (ref 12.0–15.0)
MCH: 30.9 pg (ref 26.0–34.0)
MCHC: 34.4 g/dL (ref 30.0–36.0)
MCV: 89.7 fL (ref 78.0–100.0)
PLATELETS: 149 10*3/uL — AB (ref 150–400)
RBC: 3.69 MIL/uL — AB (ref 3.87–5.11)
RDW: 14.6 % (ref 11.5–15.5)
WBC: 5.7 10*3/uL (ref 4.0–10.5)

## 2017-09-20 LAB — FOLATE: FOLATE: 5.5 ng/mL — AB (ref >5.9)

## 2017-09-20 LAB — FERRITIN: Ferritin: 9 ng/mL — ABNORMAL LOW (ref 11–307)

## 2017-09-20 LAB — RETICULOCYTES
RBC.: 3.52 MIL/uL — AB (ref 3.87–5.11)
RETIC COUNT ABSOLUTE: 21.1 10*3/uL (ref 19.0–186.0)
Retic Ct Pct: 0.6 % (ref 0.4–3.1)

## 2017-09-20 LAB — IRON AND TIBC
Iron: 54 ug/dL (ref 28–170)
SATURATION RATIOS: 17 % (ref 10.4–31.8)
TIBC: 321 ug/dL (ref 250–450)
UIBC: 267 ug/dL

## 2017-09-20 LAB — BASIC METABOLIC PANEL
ANION GAP: 7 (ref 5–15)
BUN: 11 mg/dL (ref 6–20)
CALCIUM: 8.2 mg/dL — AB (ref 8.9–10.3)
CO2: 24 mmol/L (ref 22–32)
CREATININE: 0.94 mg/dL (ref 0.44–1.00)
Chloride: 106 mmol/L (ref 101–111)
GLUCOSE: 93 mg/dL (ref 65–99)
Potassium: 3.1 mmol/L — ABNORMAL LOW (ref 3.5–5.1)
Sodium: 137 mmol/L (ref 135–145)

## 2017-09-20 LAB — CREATININE, SERUM: CREATININE: 0.89 mg/dL (ref 0.44–1.00)

## 2017-09-20 LAB — VITAMIN B12: Vitamin B-12: 380 pg/mL (ref 180–914)

## 2017-09-20 MED ORDER — LEVOTHYROXINE SODIUM 100 MCG IV SOLR
50.0000 ug | Freq: Every day | INTRAVENOUS | Status: DC
  Filled 2017-09-20: qty 5

## 2017-09-20 MED ORDER — POTASSIUM CHLORIDE CRYS ER 20 MEQ PO TBCR
40.0000 meq | EXTENDED_RELEASE_TABLET | ORAL | Status: AC
  Administered 2017-09-20 (×2): 40 meq via ORAL
  Filled 2017-09-20: qty 2

## 2017-09-20 MED ORDER — ONDANSETRON HCL 4 MG/2ML IJ SOLN: 4.0000 mg | Freq: Four times a day (QID) | INTRAMUSCULAR | Status: DC | PRN

## 2017-09-20 MED ORDER — ACETAMINOPHEN 325 MG PO TABS
650.0000 mg | ORAL_TABLET | Freq: Four times a day (QID) | ORAL | Status: DC | PRN
  Administered 2017-09-20 – 2017-09-22 (×3): 650 mg via ORAL
  Filled 2017-09-20 (×3): qty 2

## 2017-09-20 MED ORDER — ONDANSETRON HCL 4 MG PO TABS: 4.0000 mg | ORAL_TABLET | Freq: Four times a day (QID) | ORAL | Status: DC | PRN

## 2017-09-20 MED ORDER — METRONIDAZOLE 500 MG PO TABS: 500.0000 mg | ORAL_TABLET | Freq: Two times a day (BID) | ORAL | Status: DC

## 2017-09-20 MED ORDER — FOLIC ACID 1 MG PO TABS
1.0000 mg | ORAL_TABLET | Freq: Every day | ORAL | Status: DC
  Administered 2017-09-20 – 2017-09-22 (×3): 1 mg via ORAL
  Filled 2017-09-20 (×3): qty 1

## 2017-09-20 MED ORDER — ENOXAPARIN SODIUM 40 MG/0.4ML ~~LOC~~ SOLN
40.0000 mg | SUBCUTANEOUS | Status: DC
  Administered 2017-09-20 – 2017-09-22 (×3): 40 mg via SUBCUTANEOUS
  Filled 2017-09-20 (×3): qty 0.4

## 2017-09-20 MED ORDER — LEVOTHYROXINE SODIUM 100 MCG IV SOLR
62.5000 ug | Freq: Every day | INTRAVENOUS | Status: DC
  Administered 2017-09-20: 62.5 ug via INTRAVENOUS
  Filled 2017-09-20 (×2): qty 5

## 2017-09-20 MED ORDER — METRONIDAZOLE 500 MG PO TABS
2000.0000 mg | ORAL_TABLET | Freq: Once | ORAL | Status: AC
  Administered 2017-09-20: 2000 mg via ORAL
  Filled 2017-09-20: qty 4

## 2017-09-20 MED ORDER — ACETAMINOPHEN 650 MG RE SUPP: 650.0000 mg | Freq: Four times a day (QID) | RECTAL | Status: DC | PRN

## 2017-09-20 MED ORDER — SODIUM CHLORIDE 0.9 % IV SOLN
INTRAVENOUS | Status: AC
  Administered 2017-09-20: 06:00:00 via INTRAVENOUS

## 2017-09-20 MED ORDER — CYANOCOBALAMIN 1000 MCG/ML IJ SOLN
1000.0000 ug | Freq: Every day | INTRAMUSCULAR | Status: DC
  Administered 2017-09-20 – 2017-09-22 (×3): 1000 ug via INTRAMUSCULAR
  Filled 2017-09-20 (×4): qty 1

## 2017-09-20 NOTE — Progress Notes (Signed)
CSW received consult for "Medication and primary MD assistance"- please consult RN CM for assistance.   Stacy GardnerErin Amara Justen, [facility] Emergency Room Clinical Social Worker [phone]

## 2017-09-20 NOTE — Discharge Instructions (Signed)

## 2017-09-20 NOTE — Progress Notes (Signed)
Seen and assessed patient and agree with Dr.Kakrakandy's assessment and plan.  Patient currently on IV Synthroid.  Patient noted to have Trichomonas in the urine.  Check a urine GC chlamydia.  Change Flagyl to 2 g p.o. x1.  Follow.  No charge.

## 2017-09-20 NOTE — H&P (Signed)
History and Physical    Sheri HoldingJamie Hodge WNU:[id number] DOB: Sheri 24, 1992 DOA: 09/19/2017  PCP: Patient, No Pcp Per  Patient coming from: Home.  Chief Complaint: Confusion and cold intolerance.  HPI: Sheri Hodge is a 26 y.o. female with history of severe hypothyroidism who has had thyroidectomy in March of this year followed by thyroid replacement therapy presents to the ER at [facility] with complaints of having periods of confusion and cold intolerance.  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  Also has been having some constipation.  ED Course: In the ER patient is found to be bradycardic.  Labs did not show any hyponatremia.  TSH is 60.  Free T4 is low.  Since patient is concerning for possible developing myxedema patient admitted for further observation with IV Synthroid.  Review of Systems: As per HPI, rest all negative.   Past Medical History:  Diagnosis Date  . Exophthalmia   . Goiter   . Graves disease   . Graves disease   . H/O goiter   . Hypertension   . Hyperthyroidism 2016  . Seizures (HCC)     Past Surgical History:  Procedure Laterality Date  . THYROIDECTOMY       reports that she has been smoking cigarettes.  She has been smoking about 0.25 packs per day. she has never used smokeless tobacco. She reports that she does not drink alcohol or use drugs.  No Known Allergies  Family History  Problem Relation Age of Onset  . Diabetes Maternal Grandmother   . Diabetes Paternal Grandmother   . Hyperthyroidism Neg Hx     Prior to Admission medications   Medication Sig Start Date End Date Taking? Authorizing Provider  ibuprofen (ADVIL,MOTRIN) 200 MG tablet Take 400 mg every 6 (six) hours as needed by mouth for moderate pain.   Yes [provider]  levothyroxine (SYNTHROID,  LEVOTHROID) 100 MCG tablet Take 100 mcg once by mouth.   Yes [provider]  levothyroxine (SYNTHROID, LEVOTHROID) 125 MCG tablet Take 125 mcg daily before breakfast by mouth.   Yes [provider]  doxycycline (VIBRAMYCIN) 100 MG capsule Take 1 capsule (100 mg total) by mouth 2 (two) times daily. One po bid x 7 days 07/01/17   Paula LibraMolpus, John, MD    Physical Exam: Vitals:   09/20/17 0030 09/20/17 0100 09/20/17 0130 09/20/17 0319  BP: (!) 121/93 (!) 122/95 (!) 123/96 121/84  Pulse: 63 (!) 58 (!) 51 (!) 52  Resp: 16 10 16 14   Temp:    98.8 F (37.1 C)  TempSrc:    Oral  SpO2: 98% 100% 100% 100%  Weight:    80.2 kg (176 lb 12.9 oz)  Height:    5\' 5"  (1.651 m)      Constitutional: Moderately built and nourished. Vitals:   09/20/17 0030 09/20/17 0100 09/20/17 0130 09/20/17 0319  BP: (!) 121/93 (!) 122/95 (!) 123/96 121/84  Pulse: 63 (!) 58 (!) 51 (!) 52  Resp: 16 10 16 14   Temp:    98.8 F (37.1 C)  TempSrc:    Oral  SpO2: 98% 100% 100% 100%  Weight:    80.2 kg (176 lb 12.9 oz)  Height:    5\' 5"  (1.651 m)   Eyes: Anicteric no pallor. ENMT: No discharge from the ears  eyes nose or mouth. Neck: Scar seen.  No mass felt. Respiratory: No rhonchi or crepitations. Cardiovascular: S1-S2 heard. Abdomen: Soft nontender bowel sounds present. Musculoskeletal: No edema.  No joint effusion. Skin: Scar at the neck area. Neurologic: Alert awake oriented to time place and person.  Moves all extremities. Psychiatric: Appears normal.  Normal affect.   Labs on Admission: I have personally reviewed following labs and imaging studies  CBC: Recent Labs  Lab 09/19/17 1938  WBC 7.7  NEUTROABS 1.8  HGB 11.3*  HCT 33.2*  MCV 90.7  PLT 178   Basic Metabolic Panel: Recent Labs  Lab 09/19/17 1938  NA 136  K 3.5  CL 104  CO2 26  GLUCOSE 83  BUN 13  CREATININE 0.92  CALCIUM 8.4*  MG 2.2   GFR: Estimated Creatinine Clearance: 96.1 mL/min (by C-G formula based on  SCr of 0.92 mg/dL). Liver Function Tests: Recent Labs  Lab 09/19/17 1938  AST 46*  ALT 46  ALKPHOS 93  BILITOT 0.3  PROT 8.0  ALBUMIN 4.5   Recent Labs  Lab 09/19/17 1938  LIPASE 33   No results for input(s): AMMONIA in the last 168 hours. Coagulation Profile: Recent Labs  Lab 09/19/17 1938  INR 0.94   Cardiac Enzymes: No results for input(s): CKTOTAL, CKMB, CKMBINDEX, TROPONINI in the last 168 hours. BNP (last 3 results) No results for input(s): PROBNP in the last 8760 hours. HbA1C: No results for input(s): HGBA1C in the last 72 hours. CBG: No results for input(s): GLUCAP in the last 168 hours. Lipid Profile: No results for input(s): CHOL, HDL, LDLCALC, TRIG, CHOLHDL, LDLDIRECT in the last 72 hours. Thyroid Function Tests: Recent Labs    09/19/17 1938  TSH 60.308*  FREET4 0.27*   Anemia Panel: No results for input(s): VITAMINB12, FOLATE, FERRITIN, TIBC, IRON, RETICCTPCT in the last 72 hours. Urine analysis:    Component Value Date/Time   COLORURINE YELLOW 09/19/2017 1938   APPEARANCEUR CLOUDY (A) 09/19/2017 1938   LABSPEC 1.025 09/19/2017 1938   PHURINE 6.5 09/19/2017 1938   GLUCOSEU NEGATIVE 09/19/2017 1938   HGBUR NEGATIVE 09/19/2017 1938   BILIRUBINUR NEGATIVE 09/19/2017 1938   KETONESUR 15 (A) 09/19/2017 1938   PROTEINUR 30 (A) 09/19/2017 1938   NITRITE NEGATIVE 09/19/2017 1938   LEUKOCYTESUR NEGATIVE 09/19/2017 1938   Sepsis Labs: @LABRCNTIP (procalcitonin:4,lacticidven:4) )No results found for this or any previous visit (from the past 240 hour(s)).   Radiological Exams on Admission: Dg Chest 2 View  Result Date: 09/19/2017 CLINICAL DATA:  Confusion. Constipation. Disoriented. Status post thyroidectomy, out of medication. EXAM: CHEST  2 VIEW COMPARISON:  CT chest January 18, 2017 FINDINGS: Cardiac silhouette is mildly enlarged and unchanged. Mediastinal silhouette is nonsuspicious. No pleural effusion or focal consolidation. No pneumothorax. Soft  tissue planes included osseous structures are normal. IMPRESSION: Stable cardiomegaly.  No acute pulmonary process. Electronically Signed   By: Awilda Metroourtnay  Bloomer M.D.   On: 09/19/2017 19:32    EKG: Independently reviewed.  Sinus bradycardia with a rate around 52 bpm.  Assessment/Plan Principal Problem:   Hypothyroidism Active Problems:   Normocytic normochromic anemia    1. Severe hypothyroidism -concerning for developing myxedema so patient has been admitted for observation and started on IV patient at this time is alert awake and oriented and is answering questions appropriately.  Patient's last dose of Synthroid was 125 mcg so I have ordered Synthroid 62.5 mcg IV for now. 2. Trichomoniasis -patient placed on Flagyl. 3. Normocytic normochromic anemia -follow CBC and  if there is no further decline in hemoglobin then further workup as outpatient. 4. Tobacco abuse -tobacco cessation counseling requested.   DVT prophylaxis: Lovenox. Code Status: Full code. Family Communication: Discussed with patient. Disposition Plan: Home. Consults called: None. Admission status: Observation.   Eduard Clos MD [facility] Pager [phone].  If 7PM-7AM, please contact night-coverage [email or web address] Password [email or web address]  09/20/2017, 5:20 AM

## 2017-09-20 NOTE — Progress Notes (Signed)
Nutrition Brief Note  Patient identified on the Malnutrition Screening Tool (MST) Report  Wt Readings from Last 15 Encounters:  09/20/17 176 lb 12.9 oz (80.2 kg)  07/01/17 184 lb (83.5 kg)  01/18/17 164 lb (74.4 kg)  11/11/16 159 lb 1.6 oz (72.2 kg)  09/24/16 174 lb (78.9 kg)  05/22/16 155 lb (70.3 kg)  02/24/16 155 lb (70.3 kg)  09/08/15 155 lb 8 oz (70.5 kg)  06/24/15 165 lb (74.8 kg)    Body mass index is 29.42 kg/m. Patient meets criteria for overweight status based on current BMI. Pt has lost 8 lbs (4% body weight) in the past 3 months; this is not significant for time frame, weight loss beneficial given borderline obesity at this time. Pt with PMH which includes severe hypothyroidism s/p thyroidectomy in March with subsequent thyroid replacement therapy. Pt was not taking Synthroid x1 month d/t financial constraints. Presented with AMS and cold intolerance.    Current diet order is Regular. Medications reviewed; 62.5 mcg IV Synthroid/day. Labs reviewed; Ca: 8.4 mg/dL, AST slightly elevated.  IVF: NS @ 100 mL/hr.  No nutrition interventions warranted at this time. If nutrition issues arise, please consult RD.      Trenton GammonJessica Tracina Beaumont, MS, RD, LDN, [facility] Inpatient Clinical Dietitian Pager # [phone] After hours/weekend pager # [phone]

## 2017-09-20 NOTE — Care Management Note (Signed)
Case Management Note  Patient Details  Name: Sheri HoldingJamie Scavone MRN: [id number] Date of Birth: [birth year]  Subjective/Objective: 26 y/o f admitted w/hypothyroidism. From home. CM referral for med asst. Spoke to patient in rm-no pcp, albe to pay [facility] meds [facility]-provided patient w/[employer] [facility] med list. Provided w/pcp listing-chose [facility]-patient will call to make own appt. No further CM needs.                   Action/Plan:d/c plan home.   Expected Discharge Date:                  Expected Discharge Plan:  Home/Self Care  In-House Referral:     Discharge planning Services  CM Consult, Indigent Health Clinic, Medication Assistance  Post Acute Care Choice:    Choice offered to:     DME Arranged:    DME Agency:     HH Arranged:    HH Agency:     Status of Service:  In process, will continue to follow  If discussed at Long Length of Stay Meetings, dates discussed:    Additional Comments:  Lanier ClamMahabir, Ahmoni Edge, RN 09/20/2017, 12:10 PM

## 2017-09-21 DIAGNOSIS — E538 Deficiency of other specified B group vitamins: Secondary | ICD-10-CM

## 2017-09-21 DIAGNOSIS — D529 Folate deficiency anemia, unspecified: Secondary | ICD-10-CM

## 2017-09-21 DIAGNOSIS — K5900 Constipation, unspecified: Secondary | ICD-10-CM | POA: Diagnosis present

## 2017-09-21 HISTORY — DX: Constipation, unspecified: K59.00

## 2017-09-21 LAB — BASIC METABOLIC PANEL
Anion gap: 5 (ref 5–15)
BUN: 14 mg/dL (ref 6–20)
CO2: 22 mmol/L (ref 22–32)
Calcium: 7.9 mg/dL — ABNORMAL LOW (ref 8.9–10.3)
Chloride: 109 mmol/L (ref 101–111)
Creatinine, Ser: 0.95 mg/dL (ref 0.44–1.00)
Glucose, Bld: 78 mg/dL (ref 65–99)
POTASSIUM: 3.8 mmol/L (ref 3.5–5.1)
SODIUM: 136 mmol/L (ref 135–145)

## 2017-09-21 LAB — CBC
HEMATOCRIT: 30.7 % — AB (ref 36.0–46.0)
HEMOGLOBIN: 10.6 g/dL — AB (ref 12.0–15.0)
MCH: 30.8 pg (ref 26.0–34.0)
MCHC: 34.5 g/dL (ref 30.0–36.0)
MCV: 89.2 fL (ref 78.0–100.0)
Platelets: 139 10*3/uL — ABNORMAL LOW (ref 150–400)
RBC: 3.44 MIL/uL — AB (ref 3.87–5.11)
RDW: 14.5 % (ref 11.5–15.5)
WBC: 4.5 10*3/uL (ref 4.0–10.5)

## 2017-09-21 LAB — URINE CULTURE

## 2017-09-21 LAB — HIV ANTIBODY (ROUTINE TESTING W REFLEX): HIV SCREEN 4TH GENERATION: NONREACTIVE

## 2017-09-21 LAB — T3, FREE: T3, Free: <0.4 pg/mL — ABNORMAL LOW (ref 2.0–4.4)

## 2017-09-21 MED ORDER — LEVOTHYROXINE SODIUM 25 MCG PO TABS
125.0000 ug | ORAL_TABLET | Freq: Every day | ORAL | Status: DC
  Administered 2017-09-21 – 2017-09-22 (×2): 125 ug via ORAL
  Filled 2017-09-21 (×2): qty 1

## 2017-09-21 MED ORDER — SORBITOL 70 % SOLN
960.0000 mL | TOPICAL_OIL | Freq: Once | ORAL | Status: DC
  Filled 2017-09-21: qty 473

## 2017-09-21 MED ORDER — SORBITOL 70 % SOLN
30.0000 mL | Freq: Once | Status: AC
  Administered 2017-09-21: 30 mL via ORAL
  Filled 2017-09-21: qty 30

## 2017-09-21 MED ORDER — SORBITOL 70 % SOLN
30.0000 mL | Freq: Once | Status: AC
  Administered 2017-09-22: 30 mL via ORAL
  Filled 2017-09-21: qty 30

## 2017-09-21 MED ORDER — POLYETHYLENE GLYCOL 3350 17 G PO PACK
17.0000 g | PACK | Freq: Two times a day (BID) | ORAL | Status: DC
  Administered 2017-09-21 – 2017-09-22 (×3): 17 g via ORAL
  Filled 2017-09-21 (×3): qty 1

## 2017-09-21 MED ORDER — SENNOSIDES-DOCUSATE SODIUM 8.6-50 MG PO TABS
1.0000 | ORAL_TABLET | Freq: Two times a day (BID) | ORAL | Status: DC
  Administered 2017-09-21 – 2017-09-22 (×2): 1 via ORAL
  Filled 2017-09-21 (×2): qty 1

## 2017-09-21 NOTE — Progress Notes (Signed)
PROGRESS NOTE    Sheri Hodge  OZH:[id number] DOB: 08-Oct-1991 DOA: 09/19/2017 PCP: Patient, No Pcp Per    Brief Narrative:  Patient is a 26 year old female history of severe hypothyroidism status post thyroidectomy March 2018 followed by thyroid replacement therapy presented to the ED with metastases and a [place] with complaints of periods of confusion cold intolerance, constipation.  Patient had not taken his Synthroid dose for approximately a month due to financial issues.  TSH obtained on admission was 60.308.  Free T4 of 0.27.  Patient placed on IV Synthroid.   Assessment & Plan:   Principal Problem:   Hypothyroidism Active Problems:   Anemia   Trichomonas infection   Hypokalemia   Constipation  #1 hypothyroidism Patient presented with symptoms of hypothyroidism of constipation, bradycardia, cold intolerance, confusion.  Patient with some improvement in confusion however not at baseline.  TSH of 60.308.  Free T4 of 0.27.  Transition from IV Synthroid to oral Synthroid.  Supportive care.  Follow.  2.  Hypokalemia Repleted.  3.  Iron deficiency anemia/low vitamin B12 levels/folate deficiency Patient noted to have a ferritin of 9.  Folate of 5.5.  Vitamin B12 levels of 380.  H&H stable.  Patient started on IM vitamin B12 injections and folic acid.  Follow.  4.  Constipation Likely secondary to problem #1.  We will give a dose of sorbitol.  Placed on MiraLAX twice daily.  If no results will give a enema.  5.  Trichomonas Status post Flagyl 2 g p.o. x1.  Urine GC chlamydia pending.    DVT prophylaxis: Lovenox Code Status: Full Family Communication: Updated patient and mother at bedside. Disposition Plan: Home when medically stable with clinical improvement hopefully in the next 24-48 hours.   Consultants:   None  Procedures:   Chest x-ray 09/19/2017  Antimicrobials:   None   Subjective: Patient denies any chest pain.  Does endorse some shortness of  breath on ambulation.  No abdominal pain.  Complaint of constipation.  Patient also with some confusion.  Patient feels fuzzy headed.  Patient complaining of some numbness in her feet.  Objective: Vitals:   09/20/17 1245 09/20/17 2223 09/21/17 0534 09/21/17 1308  BP: 132/76 114/72 120/69 118/79  Pulse: (!) 57 70 66 61  Resp: 16 16 16 18   Temp: 98.7 F (37.1 C) 98.6 F (37 C) 98.4 F (36.9 C) 98.6 F (37 C)  TempSrc: Oral Oral Oral Oral  SpO2: 100% 98% 97% 100%  Weight:      Height:        Intake/Output Summary (Last 24 hours) at 09/21/2017 1556 Last data filed at 09/21/2017 1300 Gross per 24 hour  Intake 1468.33 ml  Output -  Net 1468.33 ml   Filed Weights   09/19/17 1659 09/20/17 0319  Weight: 83.5 kg (184 lb) 80.2 kg (176 lb 12.9 oz)    Examination:  General exam: NAD. Proptosis. Respiratory system: Clear to auscultation.  No crackles, no rhonchi, no wheezing.  Respiratory effort normal. Cardiovascular system: Regular rate rhythm no murmurs rubs or gallops.  No JVD.  No lower extremity edema.  Gastrointestinal system: Abdomen is soft, nontender, nondistended, positive bowel sounds.   Central nervous system: Alert and oriented. No focal neurological deficits. Extremities: Symmetric 5 x 5 power. Skin: No rashes, lesions or ulcers Psychiatry: Judgement and insight appear normal. Mood & affect appropriate.     Data Reviewed: I have personally reviewed following labs and imaging studies  CBC: Recent Labs  Lab  09/19/17 1938 09/20/17 0556 09/21/17 0607  WBC 7.7 5.7 4.5  NEUTROABS 1.8  --   --   HGB 11.3* 11.4* 10.6*  HCT 33.2* 33.1* 30.7*  MCV 90.7 89.7 89.2  PLT 178 149* 139*   Basic Metabolic Panel: Recent Labs  Lab 09/19/17 1938 09/20/17 0556 09/20/17 0901 09/21/17 0607  NA 136  --  137 136  K 3.5  --  3.1* 3.8  CL 104  --  106 109  CO2 26  --  24 22  GLUCOSE 83  --  93 78  BUN 13  --  11 14  CREATININE 0.92 0.89 0.94 0.95  CALCIUM 8.4*  --  8.2*  7.9*  MG 2.2  --  2.0  --    GFR: Estimated Creatinine Clearance: 93.1 mL/min (by C-G formula based on SCr of 0.95 mg/dL). Liver Function Tests: Recent Labs  Lab 09/19/17 1938  AST 46*  ALT 46  ALKPHOS 93  BILITOT 0.3  PROT 8.0  ALBUMIN 4.5   Recent Labs  Lab 09/19/17 1938  LIPASE 33   No results for input(s): AMMONIA in the last 168 hours. Coagulation Profile: Recent Labs  Lab 09/19/17 1938  INR 0.94   Cardiac Enzymes: No results for input(s): CKTOTAL, CKMB, CKMBINDEX, TROPONINI in the last 168 hours. BNP (last 3 results) No results for input(s): PROBNP in the last 8760 hours. HbA1C: No results for input(s): HGBA1C in the last 72 hours. CBG: No results for input(s): GLUCAP in the last 168 hours. Lipid Profile: No results for input(s): CHOL, HDL, LDLCALC, TRIG, CHOLHDL, LDLDIRECT in the last 72 hours. Thyroid Function Tests: Recent Labs    09/19/17 1938  TSH 60.308*  FREET4 0.27*  T3FREE <0.4*   Anemia Panel: Recent Labs    09/20/17 0901  VITAMINB12 380  FOLATE 5.5*  FERRITIN 9*  TIBC 321  IRON 54  RETICCTPCT 0.6   Sepsis Labs: No results for input(s): PROCALCITON, LATICACIDVEN in the last 168 hours.  Recent Results (from the past 240 hour(s))  Urine culture     Status: Abnormal   Collection Time: 09/19/17  7:38 PM  Result Value Ref Range Status   Specimen Description URINE, RANDOM  Final   Special Requests NONE  Final   Culture MULTIPLE SPECIES PRESENT, SUGGEST RECOLLECTION (A)  Final   Report Status 09/21/2017 FINAL  Final         Radiology Studies: Dg Chest 2 View  Result Date: 09/19/2017 CLINICAL DATA:  Confusion. Constipation. Disoriented. Status post thyroidectomy, out of medication. EXAM: CHEST  2 VIEW COMPARISON:  CT chest January 18, 2017 FINDINGS: Cardiac silhouette is mildly enlarged and unchanged. Mediastinal silhouette is nonsuspicious. No pleural effusion or focal consolidation. No pneumothorax. Soft tissue planes included  osseous structures are normal. IMPRESSION: Stable cardiomegaly.  No acute pulmonary process. Electronically Signed   By: Awilda Metroourtnay  Bloomer M.D.   On: 09/19/2017 19:32        Scheduled Meds: . cyanocobalamin  1,000 mcg Intramuscular Daily  . enoxaparin (LOVENOX) injection  40 mg Subcutaneous Q24H  . folic acid  1 mg Oral Daily  . levothyroxine  125 mcg Oral QAC breakfast  . polyethylene glycol  17 g Oral BID   Continuous Infusions:   LOS: 1 day    Time spent: 35 minutes    Ramiro Harvestaniel Perle Brickhouse, MD [facility] Pager [phone]  If 7PM-7AM, please contact night-coverage [email or web address] Password [email or web address] 09/21/2017, 3:56 PM

## 2017-09-22 LAB — BASIC METABOLIC PANEL
Anion gap: 5 (ref 5–15)
BUN: 10 mg/dL (ref 6–20)
CALCIUM: 8.1 mg/dL — AB (ref 8.9–10.3)
CO2: 25 mmol/L (ref 22–32)
CREATININE: 0.94 mg/dL (ref 0.44–1.00)
Chloride: 106 mmol/L (ref 101–111)
GFR calc Af Amer: >60 mL/min (ref >60)
GFR calc non Af Amer: >60 mL/min (ref >60)
GLUCOSE: 79 mg/dL (ref 65–99)
Potassium: 3.7 mmol/L (ref 3.5–5.1)
Sodium: 136 mmol/L (ref 135–145)

## 2017-09-22 LAB — CBC WITH DIFFERENTIAL/PLATELET
Basophils Absolute: 0 10*3/uL (ref 0.0–0.1)
Basophils Relative: 1 %
Eosinophils Absolute: 0.1 10*3/uL (ref 0.0–0.7)
Eosinophils Relative: 2 %
HEMATOCRIT: 30.4 % — AB (ref 36.0–46.0)
Hemoglobin: 10.5 g/dL — ABNORMAL LOW (ref 12.0–15.0)
LYMPHS PCT: 72 %
Lymphs Abs: 3.4 10*3/uL (ref 0.7–4.0)
MCH: 30.8 pg (ref 26.0–34.0)
MCHC: 34.5 g/dL (ref 30.0–36.0)
MCV: 89.1 fL (ref 78.0–100.0)
MONOS PCT: 2 %
Monocytes Absolute: 0.1 10*3/uL (ref 0.1–1.0)
NEUTROS ABS: 1.1 10*3/uL — AB (ref 1.7–7.7)
Neutrophils Relative %: 23 %
Platelets: 141 10*3/uL — ABNORMAL LOW (ref 150–400)
RBC: 3.41 MIL/uL — ABNORMAL LOW (ref 3.87–5.11)
RDW: 14.5 % (ref 11.5–15.5)
WBC: 4.7 10*3/uL (ref 4.0–10.5)

## 2017-09-22 MED ORDER — POLYETHYLENE GLYCOL 3350 17 G PO PACK: 17.0000 g | PACK | Freq: Every day | ORAL | 0 refills | Status: DC

## 2017-09-22 MED ORDER — SENNOSIDES-DOCUSATE SODIUM 8.6-50 MG PO TABS: 1.0000 | ORAL_TABLET | Freq: Two times a day (BID) | ORAL | Status: DC

## 2017-09-22 MED ORDER — LEVOTHYROXINE SODIUM 125 MCG PO TABS: 125.0000 ug | ORAL_TABLET | Freq: Every day | ORAL | 0 refills | Status: DC

## 2017-09-22 MED ORDER — FOLIC ACID 1 MG PO TABS: 1.0000 mg | ORAL_TABLET | Freq: Every day | ORAL | Status: DC

## 2017-09-22 MED ORDER — VITAMIN B-12 1000 MCG PO TABS: 1000.0000 ug | ORAL_TABLET | Freq: Every day | ORAL | 0 refills | Status: AC

## 2017-09-22 NOTE — Discharge Summary (Signed)
Physician Discharge Summary  Sheri Hodge ZOX:[id number] DOB: [birth year] DOA: 09/19/2017  PCP: Patient, No Pcp Per  Admit date: 09/19/2017 Discharge date: 09/22/2017  Time spent: 60 minutes  Recommendations for Outpatient Follow-up:  1. Follow-up at [facility] center in 1 week.  On follow-up patient's hypothyroidism will need to be followed up upon.  Patient will need repeat thyroid function studies done in about 4-6 weeks.  Patient's low level vitamin B12 levels as well as folic acid levels will need to be followed up upon.  Patient will need a basic metabolic profile done to follow-up on electrolytes and renal function.   Discharge Diagnoses:  Principal Problem:   Hypothyroidism Active Problems:   Anemia   Trichomonas infection   Hypokalemia   Constipation   Low vitamin B12 level   Discharge Condition: Stable and improved  Diet recommendation: Regular  Filed Weights   09/19/17 1659 09/20/17 0319  Weight: 83.5 kg (184 lb) 80.2 kg (176 lb 12.9 oz)    History of present illness:  Per Dr Sheri Hodge is a 26 y.o. female with history of severe hypothyroidism who has had thyroidectomy in March of this year followed by thyroid replacement therapy presented to the ER at [facility] with complaints of having periods of confusion and cold intolerance.  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  Also had been having some constipation.  ED Course: In the ER patient was found to be bradycardic.  Labs did not show any hyponatremia.  TSH is 60.  Free T4 is low.  Since patient is concerning for possible developing myxedema patient admitted for further observation with IV Synthroid    Hospital Course:  #1 hypothyroidism Patient presented with symptoms of hypothyroidism of constipation,  bradycardia, cold intolerance, confusion.  Patient initially placed on IV Synthroid and monitored.  Patient improved slowly with a confusion during the hospitalization. TSH of 60.308.  Free T4 of 0.27.    Patient was subsequently transitioned from IV Synthroid to oral Synthroid.  Patient will likely need thyroid function studies done in about 4-6 weeks.  Outpatient follow-up with PCP.    2.  Hypokalemia Repleted.  3.  Iron deficiency anemia/low vitamin B12 levels/folate deficiency Patient noted to have a ferritin of 9.  Folate of 5.5.  Vitamin B12 levels of 380.  H&H stable.  Patient started on IM vitamin B12 injections and folic acid.  Patient be discharged on oral vitamin B 12 and folic acid supplementation.  Outpatient follow-up.   4.  Constipation Likely secondary to problem #1.    Patient was placed on a bowel regimen with resolution of constipation.  Outpatient follow-up.    5.  Trichomonas Status post Flagyl 2 g p.o. x1.  Urine GC chlamydia pending at time of discharge. Outpatient follow up.    Procedures:  Chest x-ray 09/19/2017      Consultations:  None  Discharge Exam: Vitals:   09/21/17 2028 09/22/17 0457  BP: 119/73 107/70  Pulse: 62 69  Resp: 20 18  Temp: 98 F (36.7 C) 98.2 F (36.8 C)  SpO2: 98% 93%    General: NAD Cardiovascular: RRR Respiratory: CTAB  Discharge Instructions   Discharge Instructions    Diet general   Complete by:  As directed    Increase activity slowly   Complete by:  As directed      Current Discharge Medication List    START taking these medications   Details  folic acid (FOLVITE) 1 MG tablet Take 1 tablet (1 mg total) daily by mouth.    polyethylene glycol (MIRALAX / GLYCOLAX) packet Take 17 g daily by mouth. Qty: 30 each, Refills: 0    senna-docusate (SENOKOT-S) 8.6-50 MG tablet Take 1 tablet 2 (two) times daily by mouth.    vitamin B-12 (CYANOCOBALAMIN) 1000 MCG tablet Take 1 tablet (1,000 mcg total) daily by  mouth. Qty: 30 tablet, Refills: 0      CONTINUE these medications which have CHANGED   Details  levothyroxine (SYNTHROID, LEVOTHROID) 125 MCG tablet Take 1 tablet (125 mcg total) daily before breakfast by mouth. Qty: 30 tablet, Refills: 0      CONTINUE these medications which have NOT CHANGED   Details  ibuprofen (ADVIL,MOTRIN) 200 MG tablet Take 400 mg every 6 (six) hours as needed by mouth for moderate pain.      STOP taking these medications     doxycycline (VIBRAMYCIN) 100 MG capsule        No Known Allergies Follow-up Information    [facility]. Call.   Why:  make pcp hospital f/u appt within 1 week Contact information: [address] [place] [phone]           The results of significant diagnostics from this hospitalization (including imaging, microbiology, ancillary and laboratory) are listed below for reference.    Significant Diagnostic Studies: Dg Chest 2 View  Result Date: 09/19/2017 CLINICAL DATA:  Confusion. Constipation. Disoriented. Status post thyroidectomy, out of medication. EXAM: CHEST  2 VIEW COMPARISON:  CT chest January 18, 2017 FINDINGS: Cardiac silhouette is mildly enlarged and unchanged. Mediastinal silhouette is nonsuspicious. No pleural effusion or focal consolidation. No pneumothorax. Soft tissue planes included osseous structures are normal. IMPRESSION: Stable cardiomegaly.  No acute pulmonary process. Electronically Signed   By: Awilda Metroourtnay  Bloomer M.D.   On: 09/19/2017 19:32    Microbiology: Recent Results (from the past 240 hour(s))  Urine culture     Status: Abnormal   Collection Time: 09/19/17  7:38 PM  Result Value Ref Range Status   Specimen Description URINE, RANDOM  Final   Special Requests NONE  Final   Culture MULTIPLE SPECIES PRESENT, SUGGEST RECOLLECTION (A)  Final   Report Status 09/21/2017 FINAL  Final     Labs: Basic Metabolic Panel: Recent Labs  Lab  09/19/17 1938 09/20/17 0556 09/20/17 0901 09/21/17 0607 09/22/17 0458  NA 136  --  137 136 136  K 3.5  --  3.1* 3.8 3.7  CL 104  --  106 109 106  CO2 26  --  24 22 25   GLUCOSE 83  --  93 78 79  BUN 13  --  11 14 10   CREATININE 0.92 0.89 0.94 0.95 0.94  CALCIUM 8.4*  --  8.2* 7.9* 8.1*  MG 2.2  --  2.0  --   --    Liver Function Tests: Recent Labs  Lab 09/19/17 1938  AST 46*  ALT 46  ALKPHOS 93  BILITOT 0.3  PROT 8.0  ALBUMIN 4.5   Recent Labs  Lab 09/19/17 1938  LIPASE 33   No results for input(s): AMMONIA in the last 168 hours. CBC: Recent Labs  Lab 09/19/17 1938 09/20/17 0556 09/21/17 0607 09/22/17 0458  WBC 7.7 5.7 4.5 4.7  NEUTROABS 1.8  --   --  1.1*  HGB 11.3* 11.4* 10.6* 10.5*  HCT 33.2* 33.1* 30.7* 30.4*  MCV 90.7 89.7 89.2 89.1  PLT 178 149* 139* 141*   Cardiac Enzymes: No results for input(s): CKTOTAL, CKMB, CKMBINDEX, TROPONINI in the last 168 hours. BNP: BNP (last 3 results) No results for input(s): BNP in the last 8760 hours.  ProBNP (last 3 results) No results for input(s): PROBNP in the last 8760 hours.  CBG: No results for input(s): GLUCAP in the last 168 hours.     Signed:  Ramiro Harvestaniel Kj Imbert MD.  [facility] 09/22/2017, 12:51 PM

## 2017-09-23 LAB — GC/CHLAMYDIA PROBE AMP (~~LOC~~) NOT AT ARMC
CHLAMYDIA, DNA PROBE: NEGATIVE
Neisseria Gonorrhea: NEGATIVE

## 2018-03-22 DIAGNOSIS — N920 Excessive and frequent menstruation with regular cycle: Secondary | ICD-10-CM

## 2018-03-22 HISTORY — DX: Excessive and frequent menstruation with regular cycle: N92.0

## 2018-08-12 ENCOUNTER — Encounter (HOSPITAL_BASED_OUTPATIENT_CLINIC_OR_DEPARTMENT_OTHER): Payer: Self-pay | Admitting: *Deleted

## 2018-08-12 ENCOUNTER — Other Ambulatory Visit: Payer: Self-pay

## 2018-08-12 ENCOUNTER — Emergency Department (HOSPITAL_BASED_OUTPATIENT_CLINIC_OR_DEPARTMENT_OTHER)
Admission: EM | Admit: 2018-08-12 | Discharge: 2018-08-12 | Disposition: A | Discharge status: Home / Self Care | Payer: BC Managed Care – PPO | Attending: Emergency Medicine | Admitting: Emergency Medicine

## 2018-08-12 DIAGNOSIS — F17210 Nicotine dependence, cigarettes, uncomplicated: Secondary | ICD-10-CM | POA: Diagnosis not present

## 2018-08-12 DIAGNOSIS — Z79899 Other long term (current) drug therapy: Secondary | ICD-10-CM | POA: Diagnosis not present

## 2018-08-12 DIAGNOSIS — I10 Essential (primary) hypertension: Secondary | ICD-10-CM | POA: Insufficient documentation

## 2018-08-12 DIAGNOSIS — R51 Headache: Secondary | ICD-10-CM | POA: Insufficient documentation

## 2018-08-12 DIAGNOSIS — E039 Hypothyroidism, unspecified: Secondary | ICD-10-CM | POA: Insufficient documentation

## 2018-08-12 DIAGNOSIS — R519 Headache, unspecified: Secondary | ICD-10-CM

## 2018-08-12 LAB — COMPREHENSIVE METABOLIC PANEL WITH GFR
ALT: 14 U/L (ref 0–44)
AST: 21 U/L (ref 15–41)
Albumin: 4.1 g/dL (ref 3.5–5.0)
Alkaline Phosphatase: 62 U/L (ref 38–126)
Anion gap: 7 (ref 5–15)
BUN: 14 mg/dL (ref 6–20)
CO2: 25 mmol/L (ref 22–32)
Calcium: 8.6 mg/dL — ABNORMAL LOW (ref 8.9–10.3)
Chloride: 105 mmol/L (ref 98–111)
Creatinine, Ser: 0.7 mg/dL (ref 0.44–1.00)
GFR calc Af Amer: >60 mL/min
GFR calc non Af Amer: >60 mL/min
Glucose, Bld: 88 mg/dL (ref 70–99)
Potassium: 3.2 mmol/L — ABNORMAL LOW (ref 3.5–5.1)
Sodium: 137 mmol/L (ref 135–145)
Total Bilirubin: 0.2 mg/dL — ABNORMAL LOW (ref 0.3–1.2)
Total Protein: 7.4 g/dL (ref 6.5–8.1)

## 2018-08-12 LAB — CBC WITH DIFFERENTIAL/PLATELET
Abs Immature Granulocytes: 0.01 10*3/uL (ref 0.00–0.07)
Basophils Absolute: 0 10*3/uL (ref 0.0–0.1)
Basophils Relative: 1 %
Eosinophils Absolute: 0.1 10*3/uL (ref 0.0–0.5)
Eosinophils Relative: 2 %
HCT: 32.1 % — ABNORMAL LOW (ref 36.0–46.0)
Hemoglobin: 10.4 g/dL — ABNORMAL LOW (ref 12.0–15.0)
Immature Granulocytes: 0 %
Lymphocytes Relative: 57 %
Lymphs Abs: 3.1 10*3/uL (ref 0.7–4.0)
MCH: 29.5 pg (ref 26.0–34.0)
MCHC: 32.4 g/dL (ref 30.0–36.0)
MCV: 90.9 fL (ref 80.0–100.0)
Monocytes Absolute: 0.2 10*3/uL (ref 0.1–1.0)
Monocytes Relative: 4 %
Neutro Abs: 2 10*3/uL (ref 1.7–7.7)
Neutrophils Relative %: 36 %
Platelets: 169 10*3/uL (ref 150–400)
RBC: 3.53 MIL/uL — ABNORMAL LOW (ref 3.87–5.11)
RDW: 14.9 % (ref 11.5–15.5)
WBC: 5.6 10*3/uL (ref 4.0–10.5)
nRBC: 0 % (ref 0.0–0.2)

## 2018-08-12 LAB — PREGNANCY, URINE: PREG TEST UR: NEGATIVE

## 2018-08-12 MED ORDER — KETOROLAC TROMETHAMINE 30 MG/ML IJ SOLN
15.0000 mg | Freq: Once | INTRAMUSCULAR | Status: AC
  Administered 2018-08-12: 15 mg via INTRAVENOUS
  Filled 2018-08-12: qty 1

## 2018-08-12 MED ORDER — ONDANSETRON HCL 4 MG/2ML IJ SOLN
4.0000 mg | Freq: Once | INTRAMUSCULAR | Status: AC
  Administered 2018-08-12: 4 mg via INTRAVENOUS
  Filled 2018-08-12: qty 2

## 2018-08-12 MED ORDER — DEXAMETHASONE SODIUM PHOSPHATE 10 MG/ML IJ SOLN
10.0000 mg | Freq: Once | INTRAMUSCULAR | Status: AC
  Administered 2018-08-12: 10 mg via INTRAVENOUS
  Filled 2018-08-12: qty 1

## 2018-08-12 MED ORDER — SODIUM CHLORIDE 0.9 % IV BOLUS
1000.0000 mL | Freq: Once | INTRAVENOUS | Status: AC
  Administered 2018-08-12: 1000 mL via INTRAVENOUS

## 2018-08-12 NOTE — Discharge Instructions (Addendum)
Follow-up with your endocrinologist. Contact the hospital for your lab results tomorrow, discussed these results with your endocrinologist for further management.

## 2018-08-12 NOTE — ED Triage Notes (Signed)
Pt has had generalized body aches and pains, along with headache for a week. Also feels that she has low thyroid levels.

## 2018-08-12 NOTE — ED Provider Notes (Signed)
[facility] EMERGENCY DEPARTMENT Provider Note   CSN: [id number] Arrival date & time: 08/12/18  1803     History   Chief Complaint Chief Complaint  Patient presents with  . Headache  . Generalized Body Aches    HPI Sheri Hodge is a 27 y.o. female.  27 year old female presents with complaint of headaches for several weeks as well as cold intolerance, constipation, diarrhea, possible weight loss.  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  Patient states she is not scheduled to see her endocrinologist again until next month and believes that her thyroid levels are off.  Patient states that she has been taking her Synthroid as prescribed.  Patient last saw her endocrinologist in May, was scheduled to be seen in July and did not realize that she had missed the appointment.  Patient has been taking Motrin and Tylenol for her headaches, reports limited relief with these medications and then her headache returns.  Denies fevers, chills, cough, congestion, body aches.  No other complaints or concerns.     Past Medical History:  Diagnosis Date  . Exophthalmia   . Goiter   . Graves disease   . Graves disease   . H/O goiter   . Hypertension   . Hyperthyroidism 2016  . Seizures [facility])     Patient Active Problem List   Diagnosis Date Noted  . Constipation 09/21/2017  . Low vitamin B12 level   . Hypothyroidism 09/20/2017  . Anemia 09/20/2017  . Trichomonas infection 09/20/2017  . Hypokalemia 09/20/2017    Past Surgical History:  Procedure Laterality Date  . THYROIDECTOMY       OB History   None      Home Medications    Prior to Admission medications   Medication Sig Start Date End Date Taking? Authorizing Provider  folic acid (FOLVITE) 1 MG tablet Take 1 tablet (1 mg total) daily by mouth. 09/23/17   Rodolph Bong, MD  ibuprofen (ADVIL,MOTRIN) 200 MG tablet Take 400 mg every 6 (six) hours as needed by  mouth for moderate pain.    [provider]  levothyroxine (SYNTHROID, LEVOTHROID) 125 MCG tablet Take 1 tablet (125 mcg total) daily before breakfast by mouth. 09/22/17   Rodolph Bong, MD  polyethylene glycol [facility] Ethelene Hal) packet Take 17 g daily by mouth. 09/22/17   Rodolph Bong, MD  senna-docusate (SENOKOT-S) 8.6-50 MG tablet Take 1 tablet 2 (two) times daily by mouth. 09/22/17   Rodolph Bong, MD  vitamin B-12 (CYANOCOBALAMIN) 1000 MCG tablet Take 1 tablet (1,000 mcg total) daily by mouth. 09/22/17   Rodolph Bong, MD    Family History Family History  Problem Relation Age of Onset  . Diabetes Maternal Grandmother   . Diabetes Paternal Grandmother   . Hyperthyroidism Neg Hx     Social History Social History   Tobacco Use  . Smoking status: Light Tobacco Smoker    Packs/day: 0.25    Types: Cigarettes  . Smokeless tobacco: Never Used  Substance Use Topics  . Alcohol use: No  . Drug use: No     Allergies   Patient has no known allergies.   Review of Systems Review of Systems  Constitutional: Positive for unexpected weight change.  Eyes: Negative for visual disturbance.  Respiratory: Negative for shortness of breath.   Cardiovascular: Negative for chest pain and leg swelling.  Gastrointestinal: Positive for constipation and diarrhea. Negative for abdominal  pain, nausea and vomiting.  Endocrine: Positive for cold intolerance.  Genitourinary: Negative for dysuria and frequency.  Skin: Negative for rash and wound.  Allergic/Immunologic: Negative for immunocompromised state.  Neurological: Positive for headaches. Negative for dizziness and weakness.  Hematological: Negative for adenopathy. Does not bruise/bleed easily.  Psychiatric/Behavioral: Negative for confusion.  All other systems reviewed and are negative.    Physical Exam Updated Vital Signs BP (!) 139/94   Pulse 64   Temp 98.1 F (36.7 C) (Oral)   Resp 20   Ht 5\' 5"   (1.651 m)   Wt 78.5 kg   LMP 07/27/2018 (Approximate)   SpO2 95%   BMI 28.79 kg/m   Physical Exam  Constitutional: She is oriented to person, place, and time. She appears well-developed and well-nourished. No distress.  HENT:  Head: Normocephalic and atraumatic.  Mouth/Throat: Oropharynx is clear and moist.  Eyes: Pupils are equal, round, and reactive to light.  Neck: Neck supple.  Cardiovascular: Normal rate, regular rhythm, normal heart sounds and intact distal pulses.  Pulmonary/Chest: Effort normal and breath sounds normal.  Musculoskeletal: She exhibits no edema.  Lymphadenopathy:    She has no cervical adenopathy.  Neurological: She is alert and oriented to person, place, and time. She has normal strength. She is not disoriented. No cranial nerve deficit or sensory deficit. Coordination and gait normal. GCS eye subscore is 4. GCS verbal subscore is 5. GCS motor subscore is 6.  Skin: Skin is warm and dry. She is not diaphoretic.  Psychiatric: She has a normal mood and affect. Her behavior is normal.  Nursing note and vitals reviewed.    ED Treatments / Results  Labs (all labs ordered are listed, but only abnormal results are displayed) Labs Reviewed  COMPREHENSIVE METABOLIC PANEL - Abnormal; Notable for the following components:      Result Value   Potassium 3.2 (*)    Calcium 8.6 (*)    Total Bilirubin 0.2 (*)    All other components within normal limits  CBC WITH DIFFERENTIAL/PLATELET - Abnormal; Notable for the following components:   RBC 3.53 (*)    Hemoglobin 10.4 (*)    HCT 32.1 (*)    All other components within normal limits  PREGNANCY, URINE  TSH  T4, FREE    EKG None  Radiology No results found.  Procedures Procedures (including critical care time)  Medications Ordered in ED Medications  sodium chloride 0.9 % bolus 1,000 mL (1,000 mLs Intravenous New Bag/Given 08/12/18 1923)  ketorolac (TORADOL) 30 MG/ML injection 15 mg (15 mg Intravenous Given  08/12/18 1924)  dexamethasone (DECADRON) injection 10 mg (10 mg Intravenous Given 08/12/18 1924)  ondansetron (ZOFRAN) injection 4 mg (4 mg Intravenous Given 08/12/18 1923)     Initial Impression / Assessment and Plan / ED Course  I have reviewed the triage vital signs and the nursing notes.  Pertinent labs & imaging results that were available during my care of the patient were reviewed by me and considered in my medical decision making (see chart for details).  Clinical Course as of Aug 12 2037  Tue Aug 12, 2018  [id number] 27 year old female with history of hypothyroidism after total thyroidectomy presents with complaint of headache, cold intolerance, question weight loss as well as constipation and diarrhea.  Patient is concerned that her thyroid levels are off and is requesting these be checked today.  Patient has not followed up with her endocrinologist since she was last seen in May, levels were out of range  at that time.  Patient's exam is unremarkable, there is no edema, her CMP shows mild hypokalemia with a potassium 3.2, her CBC shows slight anemia with hemoglobin of 10.4, unchanged compared to previous labs, her pregnancy test is negative.  Patient is aware that her TSH and free T4 are send out labs, she should call for her results tomorrow as well as call her endocrinologist and arrange follow-up in the next week.  She was given a headache cocktail including Toradol, Decadron, Zofran and IV fluids, states her headache is somewhat improved.  Agrees to call her endocrinologist tomorrow.   [LM]    Clinical Course User Index [LM] Jeannie Fend, PA-C   Final Clinical Impressions(s) / ED Diagnoses   Final diagnoses:  Acute nonintractable headache, unspecified headache type    ED Discharge Orders    None       Alden Hipp 08/12/18 2038    Melene Plan, DO 08/12/18 2226

## 2018-08-13 LAB — T4, FREE: Free T4: 1.23 ng/dL (ref 0.82–1.77)

## 2018-08-13 LAB — TSH: TSH: 16.304 u[IU]/mL — ABNORMAL HIGH (ref 0.350–4.500)

## 2019-03-13 ENCOUNTER — Other Ambulatory Visit: Payer: Self-pay

## 2019-03-13 ENCOUNTER — Encounter (HOSPITAL_BASED_OUTPATIENT_CLINIC_OR_DEPARTMENT_OTHER): Payer: Self-pay | Admitting: Emergency Medicine

## 2019-03-13 ENCOUNTER — Emergency Department (HOSPITAL_BASED_OUTPATIENT_CLINIC_OR_DEPARTMENT_OTHER)
Admission: EM | Admit: 2019-03-13 | Discharge: 2019-03-14 | Disposition: A | Discharge status: Home / Self Care | Payer: BC Managed Care – PPO | Attending: Emergency Medicine | Admitting: Emergency Medicine

## 2019-03-13 DIAGNOSIS — Z91038 Other insect allergy status: Secondary | ICD-10-CM

## 2019-03-13 DIAGNOSIS — W57XXXA Bitten or stung by nonvenomous insect and other nonvenomous arthropods, initial encounter: Secondary | ICD-10-CM | POA: Diagnosis not present

## 2019-03-13 DIAGNOSIS — F17210 Nicotine dependence, cigarettes, uncomplicated: Secondary | ICD-10-CM | POA: Insufficient documentation

## 2019-03-13 DIAGNOSIS — T7840XA Allergy, unspecified, initial encounter: Secondary | ICD-10-CM | POA: Diagnosis not present

## 2019-03-13 DIAGNOSIS — E890 Postprocedural hypothyroidism: Secondary | ICD-10-CM | POA: Diagnosis not present

## 2019-03-13 DIAGNOSIS — Z79899 Other long term (current) drug therapy: Secondary | ICD-10-CM | POA: Diagnosis not present

## 2019-03-13 DIAGNOSIS — I10 Essential (primary) hypertension: Secondary | ICD-10-CM | POA: Insufficient documentation

## 2019-03-13 DIAGNOSIS — R2231 Localized swelling, mass and lump, right upper limb: Secondary | ICD-10-CM | POA: Diagnosis present

## 2019-03-13 MED ORDER — DIPHENHYDRAMINE HCL 25 MG PO CAPS
50.0000 mg | ORAL_CAPSULE | Freq: Once | ORAL | Status: AC
  Administered 2019-03-13: 23:00:00 50 mg via ORAL
  Filled 2019-03-13: qty 2

## 2019-03-13 MED ORDER — PREDNISONE 20 MG PO TABS: 40.0000 mg | ORAL_TABLET | Freq: Every day | ORAL | 0 refills | Status: AC

## 2019-03-13 NOTE — ED Notes (Signed)
Pt endorses relief of her symptoms

## 2019-03-13 NOTE — ED Provider Notes (Signed)
[facility] EMERGENCY DEPARTMENT Provider Note   CSN: [id number] Arrival date & time: 03/13/19  2119    History   Chief Complaint Chief Complaint  Patient presents with  . Insect Bite    HPI Sheri Hodge is a 28 y.o. female with a past medical history of Graves disease, HTN, seizures, who presents today for evaluation of right arm pain and swelling.  She reports that he was taking a nap on a couch with her mother's and she felt something bite her right arm.  She reports that her arm started swelling and becoming more painful at around 3.  Her pain is been worsening in her right arm is swelling.  She denies any other new exposures.  She reports that for about 1 hour prior to arrival she was having shortness of breath.  She did not see the insect that bit her.     HPI  Past Medical History:  Diagnosis Date  . Exophthalmia   . Goiter   . Graves disease   . Graves disease   . H/O goiter   . Hypertension   . Hyperthyroidism 2016  . Seizures [facility])     Patient Active Problem List   Diagnosis Date Noted  . Constipation 09/21/2017  . Low vitamin B12 level   . Hypothyroidism 09/20/2017  . Anemia 09/20/2017  . Trichomonas infection 09/20/2017  . Hypokalemia 09/20/2017    Past Surgical History:  Procedure Laterality Date  . THYROIDECTOMY       OB History   No obstetric history on file.      Home Medications    Prior to Admission medications   Medication Sig Start Date End Date Taking? Authorizing Provider  folic acid (FOLVITE) 1 MG tablet Take 1 tablet (1 mg total) daily by mouth. 09/23/17   Rodolph Bong, MD  ibuprofen (ADVIL,MOTRIN) 200 MG tablet Take 400 mg every 6 (six) hours as needed by mouth for moderate pain.    [provider]  levothyroxine (SYNTHROID, LEVOTHROID) 125 MCG tablet Take 1 tablet (125 mcg total) daily before breakfast by mouth. 09/22/17   Rodolph Bong, MD  polyethylene glycol [facility] / [facility] Hal) packet Take 17 g  daily by mouth. 09/22/17   Rodolph Bong, MD  predniSONE (DELTASONE) 20 MG tablet Take 2 tablets (40 mg total) by mouth daily for 5 days. 03/13/19 03/18/19  Cristina Gong, PA-C  senna-docusate (SENOKOT-S) 8.6-50 MG tablet Take 1 tablet 2 (two) times daily by mouth. 09/22/17   Rodolph Bong, MD  vitamin B-12 (CYANOCOBALAMIN) 1000 MCG tablet Take 1 tablet (1,000 mcg total) daily by mouth. 09/22/17   Rodolph Bong, MD    Family History Family History  Problem Relation Age of Onset  . Diabetes Maternal Grandmother   . Diabetes Paternal Grandmother   . Hyperthyroidism Neg Hx     Social History Social History   Tobacco Use  . Smoking status: Light Tobacco Smoker    Packs/day: 0.25    Types: Cigarettes  . Smokeless tobacco: Never Used  Substance Use Topics  . Alcohol use: No  . Drug use: No     Allergies   Patient has no known allergies.   Review of Systems Review of Systems  Constitutional: Negative for activity change and fever.  HENT: Negative for congestion.   Respiratory: Positive for shortness of breath. Negative for cough, chest tightness and wheezing.   Cardiovascular: Negative for chest pain.  Gastrointestinal: Negative for abdominal pain, diarrhea, nausea  and vomiting.  Genitourinary: Negative for dysuria.  Musculoskeletal: Negative for back pain and neck pain.  Skin: Positive for color change and rash.  All other systems reviewed and are negative.    Physical Exam Updated Vital Signs BP 131/86   Pulse 74   Temp 98.5 F (36.9 C) (Oral)   Resp 16   Ht  (1.651 m)   Wt 78.5 kg   SpO2 100%   BMI 28.79 kg/m   Physical Exam Vitals signs and nursing note reviewed.  Constitutional:      General: She is not in acute distress.    Appearance: She is well-developed.  HENT:     Head: Normocephalic and atraumatic.     Mouth/Throat:     Comments: No evidence of angioedema.  No intraoral edema, tongue edema. Eyes:     Conjunctiva/sclera:  Conjunctivae normal.  Neck:     Musculoskeletal: Normal range of motion and neck supple.  Cardiovascular:     Rate and Rhythm: Normal rate and regular rhythm.     Heart sounds: No murmur.  Pulmonary:     Effort: Pulmonary effort is normal. No respiratory distress.     Breath sounds: Normal breath sounds. No stridor. No wheezing.  Abdominal:     Palpations: Abdomen is soft.     Tenderness: There is no abdominal tenderness.  Skin:    General: Skin is warm and dry.     Comments: Please see clinical image.  On the right upper lateral arm there is a 12 x 12 cm area of redness and edema.  In the middle there are 3 approximately 1 cm areas of increased redness in a linear pattern.  There is also a red area under her right arm in her axilla.    Neurological:     General: No focal deficit present.     Mental Status: She is alert.  Psychiatric:        Mood and Affect: Mood normal.            ED Treatments / Results  Labs (all labs ordered are listed, but only abnormal results are displayed) Labs Reviewed - No data to display  EKG None  Radiology No results found.  Procedures Procedures (including critical care time)  Medications Ordered in ED Medications  diphenhydrAMINE (BENADRYL) capsule 50 mg (50 mg Oral Given 03/13/19 2244)     Initial Impression / Assessment and Plan / ED Course  I have reviewed the triage vital signs and the nursing notes.  Pertinent labs & imaging results that were available during my care of the patient were reviewed by me and considered in my medical decision making (see chart for details).  Clinical Course as of Mar 12 2349  Fri Mar 13, 2019  2343 Patient reevaluated.  She is requesting discharge at this time.  Her arm is much less red and indurated with general improvement.  Her shortness of breath is fully resolved.  She is requesting discharge.   [EH]    Clinical Course User Index [EH] Cristina Gong, PA-C      Presents today  for evaluation of concern for bug bite.  She felt a bite on her arm while she was sleeping and since then has developed erythema and induration around the area.  The linear pattern of 3 bites is concerning for bedbug.  She reported subjective feelings of shortness of breath.  She was not hypoxic or tachycardic.  No stridor.  She does not have  any evidence of angioedema and lungs are clear to auscultation.  She was treated with 50 mg of p.o. Benadryl, after which her redness improved significantly.  Recommended continued p.o. Benadryl, consulting an exterminator.  She is given a prescription for prednisone to help prevent worsening reaction.    Return precautions were discussed with patient who states their understanding.  At the time of discharge patient denied any unaddressed complaints or concerns.  Patient is agreeable for discharge home.   Final Clinical Impressions(s) / ED Diagnoses   Final diagnoses:  Allergic reaction to insect bite    ED Discharge Orders         Ordered    predniSONE (DELTASONE) 20 MG tablet  Daily     03/13/19 2348           Cristina GongHammond, Evalisse Prajapati W, [place]PA-C 03/13/19 2355    Arby BarrettePfeiffer, Marcy, MD 03/16/19 1531

## 2019-03-13 NOTE — Discharge Instructions (Addendum)
I have given you a prescription for steroids today.  Some common side effects include feelings of extra energy, feeling warm, increased appetite, and stomach upset.  If you are diabetic your sugars may run higher than usual.   You may take 25 to 50 mg of Benadryl every 6 hours as needed.  Benadryl will make you sleepy.  Today you received medications that may make you sleepy or impair your ability to make decisions.  For the next 24 hours please do not drive, operate heavy machinery, care for a small child with out another adult present, or perform any activities that may cause harm to you or someone else if you were to fall asleep or be impaired.   You are being prescribed a medication which may make you sleepy. Please follow up of listed precautions for at least 24 hours after taking one dose.  If your symptoms worsen or you have additional concerns please seek additional medical care and evaluation.  Please continue to monitor your wound.  You need to get an exterminator to evaluate the area where you may have been bit.  As we discussed the pattern of bites is concerning for bed bugs.

## 2019-03-13 NOTE — ED Notes (Signed)
ED Provider at bedside. 

## 2019-03-13 NOTE — ED Triage Notes (Signed)
Pt states she felt something bite her on her right arm while she was sleeping. Pt has redness and swelling to right upper arm. Pt also reports swelling under left eye.

## 2019-03-14 NOTE — ED Notes (Signed)
Pt verbalizes understanding of d/c instructions and denies any further needs at this time. 

## 2019-04-30 DIAGNOSIS — E559 Vitamin D deficiency, unspecified: Secondary | ICD-10-CM

## 2019-04-30 DIAGNOSIS — R519 Headache, unspecified: Secondary | ICD-10-CM | POA: Insufficient documentation

## 2019-04-30 HISTORY — DX: Vitamin D deficiency, unspecified: E55.9

## 2019-04-30 HISTORY — DX: Headache, unspecified: R51.9

## 2019-11-01 ENCOUNTER — Emergency Department (HOSPITAL_BASED_OUTPATIENT_CLINIC_OR_DEPARTMENT_OTHER)
Admission: EM | Admit: 2019-11-01 | Discharge: 2019-11-01 | Disposition: A | Discharge status: Home / Self Care | Payer: Self-pay | Attending: Emergency Medicine | Admitting: Emergency Medicine

## 2019-11-01 ENCOUNTER — Other Ambulatory Visit: Payer: Self-pay

## 2019-11-01 ENCOUNTER — Emergency Department (HOSPITAL_BASED_OUTPATIENT_CLINIC_OR_DEPARTMENT_OTHER): Payer: Self-pay

## 2019-11-01 ENCOUNTER — Encounter (HOSPITAL_BASED_OUTPATIENT_CLINIC_OR_DEPARTMENT_OTHER): Payer: Self-pay | Admitting: *Deleted

## 2019-11-01 DIAGNOSIS — Z79899 Other long term (current) drug therapy: Secondary | ICD-10-CM | POA: Insufficient documentation

## 2019-11-01 DIAGNOSIS — R600 Localized edema: Secondary | ICD-10-CM | POA: Insufficient documentation

## 2019-11-01 DIAGNOSIS — Z20828 Contact with and (suspected) exposure to other viral communicable diseases: Secondary | ICD-10-CM | POA: Insufficient documentation

## 2019-11-01 DIAGNOSIS — I10 Essential (primary) hypertension: Secondary | ICD-10-CM | POA: Insufficient documentation

## 2019-11-01 DIAGNOSIS — R059 Cough, unspecified: Secondary | ICD-10-CM

## 2019-11-01 DIAGNOSIS — D649 Anemia, unspecified: Secondary | ICD-10-CM | POA: Insufficient documentation

## 2019-11-01 DIAGNOSIS — R61 Generalized hyperhidrosis: Secondary | ICD-10-CM | POA: Insufficient documentation

## 2019-11-01 DIAGNOSIS — F17290 Nicotine dependence, other tobacco product, uncomplicated: Secondary | ICD-10-CM | POA: Insufficient documentation

## 2019-11-01 DIAGNOSIS — J9811 Atelectasis: Secondary | ICD-10-CM | POA: Insufficient documentation

## 2019-11-01 DIAGNOSIS — R05 Cough: Secondary | ICD-10-CM | POA: Insufficient documentation

## 2019-11-01 DIAGNOSIS — Z3202 Encounter for pregnancy test, result negative: Secondary | ICD-10-CM | POA: Insufficient documentation

## 2019-11-01 DIAGNOSIS — E039 Hypothyroidism, unspecified: Secondary | ICD-10-CM | POA: Insufficient documentation

## 2019-11-01 LAB — CBC WITH DIFFERENTIAL/PLATELET
Abs Immature Granulocytes: 0.01 10*3/uL (ref 0.00–0.07)
Basophils Absolute: 0 10*3/uL (ref 0.0–0.1)
Basophils Relative: 1 %
Eosinophils Absolute: 0.2 10*3/uL (ref 0.0–0.5)
Eosinophils Relative: 4 %
HCT: 35.4 % — ABNORMAL LOW (ref 36.0–46.0)
Hemoglobin: 11.3 g/dL — ABNORMAL LOW (ref 12.0–15.0)
Immature Granulocytes: 0 %
Lymphocytes Relative: 55 %
Lymphs Abs: 3.4 10*3/uL (ref 0.7–4.0)
MCH: 27.8 pg (ref 26.0–34.0)
MCHC: 31.9 g/dL (ref 30.0–36.0)
MCV: 87 fL (ref 80.0–100.0)
Monocytes Absolute: 0.2 10*3/uL (ref 0.1–1.0)
Monocytes Relative: 3 %
Neutro Abs: 2.3 10*3/uL (ref 1.7–7.7)
Neutrophils Relative %: 37 %
Platelets: 216 10*3/uL (ref 150–400)
RBC: 4.07 MIL/uL (ref 3.87–5.11)
RDW: 16.2 % — ABNORMAL HIGH (ref 11.5–15.5)
WBC: 6.1 10*3/uL (ref 4.0–10.5)
nRBC: 0 % (ref 0.0–0.2)

## 2019-11-01 LAB — URINALYSIS, ROUTINE W REFLEX MICROSCOPIC
Bilirubin Urine: NEGATIVE
Glucose, UA: NEGATIVE mg/dL
Hgb urine dipstick: NEGATIVE
Ketones, ur: NEGATIVE mg/dL
Leukocytes,Ua: NEGATIVE
Nitrite: NEGATIVE
Protein, ur: NEGATIVE mg/dL
Specific Gravity, Urine: 1.02 (ref 1.005–1.030)
pH: 8.5 — ABNORMAL HIGH (ref 5.0–8.0)

## 2019-11-01 LAB — BASIC METABOLIC PANEL
Anion gap: 6 (ref 5–15)
BUN: 11 mg/dL (ref 6–20)
CO2: 24 mmol/L (ref 22–32)
Calcium: 8.3 mg/dL — ABNORMAL LOW (ref 8.9–10.3)
Chloride: 106 mmol/L (ref 98–111)
Creatinine, Ser: 0.77 mg/dL (ref 0.44–1.00)
GFR calc Af Amer: >60 mL/min (ref >60)
GFR calc non Af Amer: >60 mL/min (ref >60)
Glucose, Bld: 83 mg/dL (ref 70–99)
Potassium: 3.5 mmol/L (ref 3.5–5.1)
Sodium: 136 mmol/L (ref 135–145)

## 2019-11-01 LAB — BRAIN NATRIURETIC PEPTIDE: B Natriuretic Peptide: 24.5 pg/mL (ref 0.0–100.0)

## 2019-11-01 LAB — PREGNANCY, URINE: Preg Test, Ur: NEGATIVE

## 2019-11-01 MED ORDER — DOXYCYCLINE HYCLATE 100 MG PO CAPS: 100.0000 mg | ORAL_CAPSULE | Freq: Two times a day (BID) | ORAL | 0 refills | Status: DC

## 2019-11-01 NOTE — ED Triage Notes (Signed)
Pt reports productive cough x 1 month. States her chest feels tight. Also states she has hx of anemia. Reports fever at night

## 2019-11-01 NOTE — ED Notes (Signed)
Pt requesting preg test, will notify EDP of pts request

## 2019-11-01 NOTE — Discharge Instructions (Addendum)
Contact a health care provider if you: Have new symptoms. Cough up pus. Have a cough that does not get better after 2-3 weeks or gets worse. Cannot control your cough with cough suppressant medicines and you are losing sleep. Have pain that gets worse or pain that is not helped with medicine. Have a fever. Have unexplained weight loss. Have night sweats.

## 2019-11-01 NOTE — ED Provider Notes (Signed)
[facility] EMERGENCY DEPARTMENT Provider Note   CSN: [id number] Arrival date & time: 11/01/19  1853     History Chief Complaint  Patient presents with  . Cough    Sheri Hodge is a 28 y.o. female  With a pmh of Hyperthyroidism s/p thyroidectomy who presents with a cc of cough. She states that she has had a productive cough for the past 1 month.  She also complains of being cold all the time.  She states that at night she wakes up with soaking night sweats where she has to change her sheets and shower.  This is also been going on for about 1 month.  She has intermittent peripheral edema which has been going on since the onset of her hyperthyroidism.  She also has episodes of PND which has also been going on since she had her thyroidectomy a couple years ago.  She states that the cough is new.  She has occasional reflux but nothing on a regular basis.  HPI     Past Medical History:  Diagnosis Date  . Exophthalmia   . Goiter   . Graves disease   . Graves disease   . H/O goiter   . Hypertension   . Hyperthyroidism 2016  . Seizures [facility])     Patient Active Problem List   Diagnosis Date Noted  . Constipation 09/21/2017  . Low vitamin B12 level   . Hypothyroidism 09/20/2017  . Anemia 09/20/2017  . Trichomonas infection 09/20/2017  . Hypokalemia 09/20/2017    Past Surgical History:  Procedure Laterality Date  . THYROIDECTOMY       OB History   No obstetric history on file.     Family History  Problem Relation Age of Onset  . Diabetes Maternal Grandmother   . Diabetes Paternal Grandmother   . Hyperthyroidism Neg Hx     Social History   Tobacco Use  . Smoking status: Light Tobacco Smoker    Packs/day: 0.25    Types: Cigars  . Smokeless tobacco: Never Used  Substance Use Topics  . Alcohol use: Yes    Comment: wine on the weekends  . Drug use: No    Home Medications Prior to Admission medications   Medication Sig Start Date End Date Taking?  Authorizing Provider  folic acid (FOLVITE) 1 MG tablet Take 1 tablet (1 mg total) daily by mouth. 09/23/17   Rodolph Bong, MD  ibuprofen (ADVIL,MOTRIN) 200 MG tablet Take 400 mg every 6 (six) hours as needed by mouth for moderate pain.    [provider]  levothyroxine (SYNTHROID, LEVOTHROID) 125 MCG tablet Take 1 tablet (125 mcg total) daily before breakfast by mouth. 09/22/17   Rodolph Bong, MD  polyethylene glycol [facility] / Ethelene Hal) packet Take 17 g daily by mouth. 09/22/17   Rodolph Bong, MD  senna-docusate (SENOKOT-S) 8.6-50 MG tablet Take 1 tablet 2 (two) times daily by mouth. 09/22/17   Rodolph Bong, MD  vitamin B-12 (CYANOCOBALAMIN) 1000 MCG tablet Take 1 tablet (1,000 mcg total) daily by mouth. 09/22/17   Rodolph Bong, MD    Allergies    Patient has no known allergies.  Review of Systems   Review of Systems Ten systems reviewed and are negative for acute change, except as noted in the HPI.   Physical Exam Updated Vital Signs BP (!) 124/98 (BP Location: Right Arm)   Pulse 82   Temp 98.7 F (37.1 C) (Oral)   Resp 15  Ht 5\' 5"  (1.651 m)   Wt 83.9 kg   SpO2 100%   BMI 30.79 kg/m   Physical Exam Vitals and nursing note reviewed.  Constitutional:      General: She is not in acute distress.    Appearance: She is well-developed. She is not diaphoretic.  HENT:     Head: Normocephalic and atraumatic.  Eyes:     General: No scleral icterus.    Conjunctiva/sclera: Conjunctivae normal.  Cardiovascular:     Rate and Rhythm: Normal rate and regular rhythm.     Heart sounds: Normal heart sounds. No murmur. No friction rub. No gallop.   Pulmonary:     Effort: Pulmonary effort is normal. No respiratory distress.     Breath sounds: Normal breath sounds.  Abdominal:     General: Bowel sounds are normal. There is no distension.     Palpations: Abdomen is soft. There is no mass.     Tenderness: There is no abdominal tenderness. There is no  guarding.  Musculoskeletal:     Cervical back: Normal range of motion.  Skin:    General: Skin is warm and dry.  Neurological:     Mental Status: She is alert and oriented to person, place, and time.  Psychiatric:        Behavior: Behavior normal.     ED Results / Procedures / Treatments   Labs (all labs ordered are listed, but only abnormal results are displayed) Labs Reviewed  SARS CORONAVIRUS 2 (TAT 6-24 HRS)  BASIC METABOLIC PANEL  CBC WITH DIFFERENTIAL/PLATELET  URINALYSIS, ROUTINE W REFLEX MICROSCOPIC  PREGNANCY, URINE    EKG None ED ECG REPORT   Date: 11/01/2019  Rate: 69  Rhythm: normal sinus rhythm  QRS Axis: indeterminate  Intervals: QT prolonged  ST/T Wave abnormalities: normal  Conduction Disutrbances:none  Narrative Interpretation:   I have personally reviewed the EKG tracing and agree with the computerized printout as noted.  Radiology DG Chest Port 1 View  Result Date: 11/01/2019 CLINICAL DATA:  Duct cough for 1 month. EXAM: PORTABLE CHEST 1 VIEW COMPARISON:  Two-view chest x-ray 05/22/2019 FINDINGS: The heart size is within normal limits. Size is exaggerated by low lung volumes. Mild bibasilar densities are present. Upper lung fields are clear. There is no edema or effusion. IMPRESSION: 1. Low lung volumes. 2. Mild bibasilar airspace disease likely reflects atelectasis. Infection is considered less likely. Electronically Signed   By: Marin Robertshristopher  Mattern M.D.   On: 11/01/2019 20:15    Procedures Procedures (including critical care time)  Medications Ordered in ED Medications - No data to display  ED Course  I have reviewed the triage vital signs and the nursing notes.  Pertinent labs & imaging results that were available during my care of the patient were reviewed by me and considered in my medical decision making (see chart for details).    MDM Rules/Calculators/A&P                      28 year old female with a past medical history of  hyperthyroidism presents emergency department chief complaint of cough.  She has been having soaking night sweats for the past month.  Cough is productive.Differential diagnosis for emergent cause of cough includes but is not limited to upper respiratory infection, lower respiratory infection, allergies, asthma, irritants, foreign body, medications such as ACE inhibitors, reflux, asthma, CHF, lung cancer, interstitial lung disease, psychiatric causes, postnasal drip and postinfectious bronchospasm. I personally reviewed the patient's labs  which shows normal BNP.  Negative pregnancy test.  Covid test is pending.  BMP shows no significant abnormalities.  Urinalysis is negative for infection.  She has mild anemia. Personally reviewed the patient's 2 view chest x-ray which shows bilateral atelectasis of the lung bases. Because the patient is having night sweats will treat with 7 days of doxy. Close PCP f/u. Discussed return precautions  Iyanna Drummer was evaluated in Emergency Department on 11/01/2019 for the symptoms described in the history of present illness. She was evaluated in the context of the global COVID-19 pandemic, which necessitated consideration that the patient might be at risk for infection with the SARS-CoV-2 virus that causes COVID-19. Institutional protocols and algorithms that pertain to the evaluation of patients at risk for COVID-19 are in a state of rapid change based on information released by regulatory bodies including the CDC and federal and state organizations. These policies and algorithms were followed during the patient's care in the ED.  Final Clinical Impression(s) / ED Diagnoses Final diagnoses:  None    Rx / DC Orders ED Discharge Orders    None       Margarita Mail, PA-C 11/01/19 2322    Margette Fast, MD 11/02/19 1308

## 2019-11-01 NOTE — ED Notes (Signed)
ED Provider at bedside. 

## 2019-11-01 NOTE — ED Notes (Signed)
Portable Xray at bedside.

## 2019-11-02 LAB — SARS CORONAVIRUS 2 (TAT 6-24 HRS): SARS Coronavirus 2: NEGATIVE

## 2019-12-08 DIAGNOSIS — G4730 Sleep apnea, unspecified: Secondary | ICD-10-CM

## 2019-12-08 HISTORY — DX: Sleep apnea, unspecified: G47.30

## 2020-07-05 ENCOUNTER — Emergency Department (HOSPITAL_BASED_OUTPATIENT_CLINIC_OR_DEPARTMENT_OTHER): Payer: BLUE CROSS/BLUE SHIELD

## 2020-07-05 ENCOUNTER — Other Ambulatory Visit: Payer: Self-pay

## 2020-07-05 ENCOUNTER — Emergency Department (HOSPITAL_BASED_OUTPATIENT_CLINIC_OR_DEPARTMENT_OTHER)
Admission: EM | Admit: 2020-07-05 | Discharge: 2020-07-05 | Disposition: A | Discharge status: Home / Self Care | Payer: BLUE CROSS/BLUE SHIELD | Attending: Emergency Medicine | Admitting: Emergency Medicine

## 2020-07-05 ENCOUNTER — Encounter (HOSPITAL_BASED_OUTPATIENT_CLINIC_OR_DEPARTMENT_OTHER): Payer: Self-pay | Admitting: *Deleted

## 2020-07-05 DIAGNOSIS — R110 Nausea: Secondary | ICD-10-CM | POA: Insufficient documentation

## 2020-07-05 DIAGNOSIS — Z79899 Other long term (current) drug therapy: Secondary | ICD-10-CM | POA: Diagnosis not present

## 2020-07-05 DIAGNOSIS — Z20822 Contact with and (suspected) exposure to covid-19: Secondary | ICD-10-CM | POA: Insufficient documentation

## 2020-07-05 DIAGNOSIS — I10 Essential (primary) hypertension: Secondary | ICD-10-CM | POA: Insufficient documentation

## 2020-07-05 DIAGNOSIS — R05 Cough: Secondary | ICD-10-CM | POA: Diagnosis present

## 2020-07-05 DIAGNOSIS — F17290 Nicotine dependence, other tobacco product, uncomplicated: Secondary | ICD-10-CM | POA: Insufficient documentation

## 2020-07-05 DIAGNOSIS — J069 Acute upper respiratory infection, unspecified: Secondary | ICD-10-CM | POA: Insufficient documentation

## 2020-07-05 DIAGNOSIS — R5383 Other fatigue: Secondary | ICD-10-CM | POA: Diagnosis not present

## 2020-07-05 LAB — SARS CORONAVIRUS 2 BY RT PCR (HOSPITAL ORDER, PERFORMED IN ~~LOC~~ HOSPITAL LAB): SARS Coronavirus 2: NEGATIVE

## 2020-07-05 MED ORDER — FLUTICASONE PROPIONATE 50 MCG/ACT NA SUSP: 2.0000 | Freq: Every day | NASAL | 0 refills | Status: DC

## 2020-07-05 MED ORDER — CETIRIZINE-PSEUDOEPHEDRINE ER 5-120 MG PO TB12: 1.0000 | ORAL_TABLET | Freq: Every day | ORAL | 0 refills | Status: DC

## 2020-07-05 MED ORDER — ALBUTEROL SULFATE HFA 108 (90 BASE) MCG/ACT IN AERS: 1.0000 | INHALATION_SPRAY | Freq: Four times a day (QID) | RESPIRATORY_TRACT | 0 refills | Status: AC | PRN

## 2020-07-05 NOTE — Discharge Instructions (Signed)
Take the medication as prescribed.  Return for new or worsening symptoms. 

## 2020-07-05 NOTE — ED Triage Notes (Signed)
Pt reports cough, congestion, and intermittent cp with sob x 3 days, denies any fever or other c/o.

## 2020-07-05 NOTE — ED Provider Notes (Signed)
[facility] EMERGENCY DEPARTMENT Provider Note   CSN: [id number] Arrival date & time: 07/05/20  1653    History Chief Complaint  Patient presents with  . Cough    Sheri Hodge is a 29 y.o. female with past history significant for hypertension, seizures who presents evaluation of upper respiratory complaints. Patient with cough productive of yellow sputum, clear rhinorrhea, CP and shortness of breath with coughing as well as myalgias and some nausea x5 days. Symptoms started last Friday. She is day #5 of symptoms. She had multiple Covid exposures.  She is not vaccinated.  She is been tolerating p.o. intake at home without difficulty.  Patient needs a note for work.  Denies fever, headache, lightheadedness, dizziness, abdominal pain, diarrhea, dysuria.  Denies additional aggravating or relieving factors.  No unilateral leg swelling, redness or warmth.  No prior history of PE, DVT, recent surgery, ablation, anticoagulation, exogenous hormone use. No pleuritic or exertional chest pain.  No associated diaphoresis, nausea, vomiting with chest pain.  History obtained from patient and past medical record.  No interpreter used.  HPI     Past Medical History:  Diagnosis Date  . Exophthalmia   . Goiter   . Graves disease   . Graves disease   . H/O goiter   . Hypertension   . Hyperthyroidism 2016  . Seizures [facility])     Patient Active Problem List   Diagnosis Date Noted  . Constipation 09/21/2017  . Low vitamin B12 level   . Hypothyroidism 09/20/2017  . Anemia 09/20/2017  . Trichomonas infection 09/20/2017  . Hypokalemia 09/20/2017    Past Surgical History:  Procedure Laterality Date  . THYROIDECTOMY       OB History   No obstetric history on file.     Family History  Problem Relation Age of Onset  . Diabetes Maternal Grandmother   . Diabetes Paternal Grandmother   . Hyperthyroidism Neg Hx     Social History   Tobacco Use  . Smoking status: Light Tobacco  Smoker    Packs/day: 0.25    Types: Cigars  . Smokeless tobacco: Never Used  Vaping Use  . Vaping Use: Never used  Substance Use Topics  . Alcohol use: Yes    Comment: wine on the weekends  . Drug use: No    Home Medications Prior to Admission medications   Medication Sig Start Date End Date Taking? Authorizing Provider  albuterol (VENTOLIN HFA) 108 (90 Base) MCG/ACT inhaler Inhale 1-2 puffs into the lungs every 6 (six) hours as needed for wheezing or shortness of breath. 07/05/20   Denarius Sesler A, PA-C  cetirizine-pseudoephedrine (ZYRTEC-D) 5-120 MG tablet Take 1 tablet by mouth daily. 07/05/20   Allesandra Huebsch A, PA-C  doxycycline (VIBRAMYCIN) 100 MG capsule Take 1 capsule (100 mg total) by mouth 2 (two) times daily. One po bid x 7 days 11/01/19   Margarita Mail, PA-C  fluticasone [facility]) 50 MCG/ACT nasal spray Place 2 sprays into both nostrils daily. 07/05/20   Hawley Michel A, PA-C  folic acid (FOLVITE) 1 MG tablet Take 1 tablet (1 mg total) daily by mouth. 09/23/17   Eugenie Filler, MD  ibuprofen (ADVIL,MOTRIN) 200 MG tablet Take 400 mg every 6 (six) hours as needed by mouth for moderate pain.    [provider]  levothyroxine (SYNTHROID, LEVOTHROID) 125 MCG tablet Take 1 tablet (125 mcg total) daily before breakfast by mouth. 09/22/17   Eugenie Filler, MD  polyethylene glycol [facility] / Floria Raveling) packet  Take 17 g daily by mouth. 09/22/17   Eugenie Filler, MD  senna-docusate (SENOKOT-S) 8.6-50 MG tablet Take 1 tablet 2 (two) times daily by mouth. 09/22/17   Eugenie Filler, MD  vitamin B-12 (CYANOCOBALAMIN) 1000 MCG tablet Take 1 tablet (1,000 mcg total) daily by mouth. 09/22/17   Eugenie Filler, MD    Allergies    Patient has no known allergies.  Review of Systems   Review of Systems  Constitutional: Positive for activity change, appetite change and fatigue.  HENT: Positive for congestion, postnasal drip and rhinorrhea. Negative for sore  throat and voice change.   Respiratory: Positive for cough and shortness of breath (With coughing). Negative for apnea, choking, chest tightness, wheezing and stridor.   Cardiovascular: Positive for chest pain (With coughing).  Gastrointestinal: Positive for nausea. Negative for abdominal distention, abdominal pain, anal bleeding, blood in stool, constipation, diarrhea, rectal pain and vomiting.  Genitourinary: Negative.   Musculoskeletal: Negative.   Skin: Negative.   Neurological: Negative.   All other systems reviewed and are negative.   Physical Exam Updated Vital Signs BP (!) 99/59 (BP Location: Right Arm)   Pulse 74   Temp 98 F (36.7 C) (Oral)   Resp 18   Ht _0  (1.651 m)   Wt 88.9 kg   LMP 06/06/2020   SpO2 100%   BMI 32.62 kg/m   Physical Exam Vitals and nursing note reviewed.  Constitutional:      General: She is not in acute distress.    Appearance: She is not ill-appearing, toxic-appearing or diaphoretic.  HENT:     Head: Normocephalic and atraumatic.     Jaw: There is normal jaw occlusion.     Right Ear: Tympanic membrane, ear canal and external ear normal. There is no impacted cerumen. No hemotympanum. Tympanic membrane is not injected, scarred, perforated, erythematous, retracted or bulging.     Left Ear: Tympanic membrane, ear canal and external ear normal. There is no impacted cerumen. No hemotympanum. Tympanic membrane is not injected, scarred, perforated, erythematous, retracted or bulging.     Ears:     Comments: No Mastoid tenderness.    Nose:     Comments: Clear rhinorrhea and congestion to bilateral nares.  No sinus tenderness.    Mouth/Throat:     Comments: Posterior oropharynx clear.  Mucous membranes moist.  Tonsils without erythema or exudate.  Uvula midline without deviation.  No evidence of PTA or RPA.  No drooling, dysphasia or trismus.  Phonation normal. Neck:     Trachea: Trachea and phonation normal.     Meningeal: Brudzinski's sign and  Kernig's sign absent.     Comments: No Neck stiffness or neck rigidity.  No meningismus.  No cervical lymphadenopathy. Cardiovascular:     Comments: No murmurs rubs or gallops. Pulmonary:     Comments: Clear to auscultation bilaterally without wheeze, rhonchi or rales.  No accessory muscle usage.  Able speak in full sentences. Abdominal:     Comments: Soft, nontender without rebound or guarding.  No CVA tenderness.  Musculoskeletal:     Comments: Moves all 4 extremities without difficulty.  Lower extremities without edema, erythema or warmth.  Skin:    Comments: Brisk capillary refill.  No rashes or lesions.  Neurological:     Mental Status: She is alert.     Comments: Ambulatory in department without difficulty.  Cranial nerves II through XII grossly intact.  No facial droop.  No aphasia.     ED  Results / Procedures / Treatments   Labs (all labs ordered are listed, but only abnormal results are displayed) Labs Reviewed  SARS CORONAVIRUS 2 BY RT PCR ([place] [place] [facility])    EKG None  Radiology DG Chest 2 View  Result Date: 07/05/2020 CLINICAL DATA:  Cough, congestion, intermittent shortness of breath for 3 days EXAM: CHEST - 2 VIEW COMPARISON:  11/01/2019 FINDINGS: Frontal and lateral views of the chest demonstrate an unremarkable cardiac silhouette. No airspace disease, effusion, or pneumothorax. There are no acute bony abnormalities. IMPRESSION: 1. No acute intrathoracic process. Electronically Signed   By: Randa Ngo M.D.   On: 07/05/2020 21:21   Procedures Procedures (including critical care time)  Medications Ordered in ED Medications - No data to display  ED Course  I have reviewed the triage vital signs and the nursing notes.  Pertinent labs & imaging results that were available during my care of the patient were reviewed by me and considered in my medical decision making (see chart for details).  29 year old presents for  evaluation of upper respiratory symptoms.  She is afebrile, nonseptic, not ill-appearing.  Heart and lungs clear.  Abdomen soft, nontender.  No unilateral leg swelling, redness or warmth.  She is PERC negative, Wells criteria low risk.  She does get some chest pain shortness of breath with coughing however this is nonexertional nonpleuritic in nature.  Patient's chest pain and SOB only occur with coughing.  Low suspicion for CS, PE, dissection, bacterial etiology of symptoms.  Symptoms reproducible on exam.  Tolerating p.o. intake without difficulty.    Labs and imaging obtained from triage personally reviewed and interpreted: Covid negative Chest x-ray without infiltrates, cardiomegaly, pulmonary MAC, pneumothorax. EKG without STEMI  Patient ambulatory to any hypoxia.  She appears overall well.  Does not appear septic.  Discussed likely viral etiology of symptoms.  Question given her multiple Covid exposures if swab today was not adequate.  Discussed symptomatic management at home as well as continued isolation and return for any worsening symptoms.  The patient has been appropriately medically screened and/or stabilized in the ED. I have low suspicion for any other emergent medical condition which would require further screening, evaluation or treatment in the ED or require inpatient management.  Patient is hemodynamically stable and in no acute distress.  Patient able to ambulate in department prior to ED.  Evaluation does not show acute pathology that would require ongoing or additional emergent interventions while in the emergency department or further inpatient treatment.  I have discussed the diagnosis with the patient and answered all questions.  Pain is been managed while in the emergency department and patient has no further complaints prior to discharge.  Patient is comfortable with plan discussed in room and is stable for discharge at this time.  I have discussed strict return precautions for  returning to the emergency department.  Patient was encouraged to follow-up with PCP/specialist refer to at discharge.    MDM Rules/Calculators/A&P                          Sheri Hodge was evaluated in Emergency Department on 07/05/2020 for the symptoms described in the history of present illness. She was evaluated in the context of the global COVID-19 pandemic, which necessitated consideration that the patient might be at risk for infection with the SARS-CoV-2 virus that causes COVID-19. Institutional protocols and algorithms that pertain to the evaluation of patients  at risk for COVID-19 are in a state of rapid change based on information released by regulatory bodies including the CDC and federal and state organizations. These policies and algorithms were followed during the patient's care in the ED. Final Clinical Impression(s) / ED Diagnoses Final diagnoses:  Viral URI with cough    Rx / DC Orders ED Discharge Orders         Ordered    fluticasone (FLONASE) 50 MCG/ACT nasal spray  Daily        07/05/20 2156    cetirizine-pseudoephedrine (ZYRTEC-D) 5-120 MG tablet  Daily        07/05/20 2156    albuterol (VENTOLIN HFA) 108 (90 Base) MCG/ACT inhaler  Every 6 hours PRN        07/05/20 2156           Bryce Kimble A, PA-C 07/05/20 2156    Little, Wenda Overland, MD 07/06/20 [id number]

## 2020-12-13 DIAGNOSIS — H9313 Tinnitus, bilateral: Secondary | ICD-10-CM

## 2020-12-13 DIAGNOSIS — E890 Postprocedural hypothyroidism: Secondary | ICD-10-CM

## 2020-12-13 DIAGNOSIS — Z9889 Other specified postprocedural states: Secondary | ICD-10-CM

## 2020-12-13 HISTORY — DX: Other specified postprocedural states: Z98.890

## 2020-12-13 HISTORY — DX: Tinnitus, bilateral: H93.13

## 2020-12-13 HISTORY — DX: Postprocedural hypothyroidism: E89.0

## 2021-03-30 ENCOUNTER — Emergency Department (HOSPITAL_BASED_OUTPATIENT_CLINIC_OR_DEPARTMENT_OTHER)
Admission: EM | Admit: 2021-03-30 | Discharge: 2021-03-30 | Disposition: A | Discharge status: Home / Self Care | Payer: BLUE CROSS/BLUE SHIELD | Attending: Emergency Medicine | Admitting: Emergency Medicine

## 2021-03-30 ENCOUNTER — Emergency Department (HOSPITAL_BASED_OUTPATIENT_CLINIC_OR_DEPARTMENT_OTHER): Payer: BLUE CROSS/BLUE SHIELD

## 2021-03-30 ENCOUNTER — Encounter (HOSPITAL_BASED_OUTPATIENT_CLINIC_OR_DEPARTMENT_OTHER): Payer: Self-pay | Admitting: Emergency Medicine

## 2021-03-30 ENCOUNTER — Other Ambulatory Visit: Payer: Self-pay

## 2021-03-30 DIAGNOSIS — R519 Headache, unspecified: Secondary | ICD-10-CM | POA: Diagnosis not present

## 2021-03-30 DIAGNOSIS — Z79899 Other long term (current) drug therapy: Secondary | ICD-10-CM | POA: Diagnosis not present

## 2021-03-30 DIAGNOSIS — F17290 Nicotine dependence, other tobacco product, uncomplicated: Secondary | ICD-10-CM | POA: Diagnosis not present

## 2021-03-30 DIAGNOSIS — R040 Epistaxis: Secondary | ICD-10-CM | POA: Diagnosis not present

## 2021-03-30 DIAGNOSIS — R002 Palpitations: Secondary | ICD-10-CM | POA: Diagnosis present

## 2021-03-30 DIAGNOSIS — Z91040 Latex allergy status: Secondary | ICD-10-CM | POA: Diagnosis not present

## 2021-03-30 DIAGNOSIS — R0981 Nasal congestion: Secondary | ICD-10-CM | POA: Insufficient documentation

## 2021-03-30 DIAGNOSIS — E039 Hypothyroidism, unspecified: Secondary | ICD-10-CM | POA: Insufficient documentation

## 2021-03-30 LAB — CBC WITH DIFFERENTIAL/PLATELET
Abs Immature Granulocytes: 0.01 10*3/uL (ref 0.00–0.07)
Basophils Absolute: 0 10*3/uL (ref 0.0–0.1)
Basophils Relative: 0 %
Eosinophils Absolute: 0.2 10*3/uL (ref 0.0–0.5)
Eosinophils Relative: 3 %
HCT: 34 % — ABNORMAL LOW (ref 36.0–46.0)
Hemoglobin: 10.8 g/dL — ABNORMAL LOW (ref 12.0–15.0)
Immature Granulocytes: 0 %
Lymphocytes Relative: 38 %
Lymphs Abs: 2.2 10*3/uL (ref 0.7–4.0)
MCH: 27.8 pg (ref 26.0–34.0)
MCHC: 31.8 g/dL (ref 30.0–36.0)
MCV: 87.6 fL (ref 80.0–100.0)
Monocytes Absolute: 0.3 10*3/uL (ref 0.1–1.0)
Monocytes Relative: 6 %
Neutro Abs: 3 10*3/uL (ref 1.7–7.7)
Neutrophils Relative %: 53 %
Platelets: 231 10*3/uL (ref 150–400)
RBC: 3.88 MIL/uL (ref 3.87–5.11)
RDW: 16.5 % — ABNORMAL HIGH (ref 11.5–15.5)
WBC: 5.8 10*3/uL (ref 4.0–10.5)
nRBC: 0 % (ref 0.0–0.2)

## 2021-03-30 LAB — TSH: TSH: 0.026 u[IU]/mL — ABNORMAL LOW (ref 0.350–4.500)

## 2021-03-30 LAB — BASIC METABOLIC PANEL
Anion gap: 7 (ref 5–15)
BUN: 11 mg/dL (ref 6–20)
CO2: 25 mmol/L (ref 22–32)
Calcium: 8.5 mg/dL — ABNORMAL LOW (ref 8.9–10.3)
Chloride: 106 mmol/L (ref 98–111)
Creatinine, Ser: 0.62 mg/dL (ref 0.44–1.00)
GFR, Estimated: >60 mL/min (ref >60)
Glucose, Bld: 99 mg/dL (ref 70–99)
Potassium: 3.5 mmol/L (ref 3.5–5.1)
Sodium: 138 mmol/L (ref 135–145)

## 2021-03-30 LAB — TROPONIN I (HIGH SENSITIVITY)
Troponin I (High Sensitivity): 3 ng/L (ref <18)
Troponin I (High Sensitivity): 3 ng/L (ref <18)

## 2021-03-30 LAB — D-DIMER, QUANTITATIVE: D-Dimer, Quant: 0.35 ug/mL-FEU (ref 0.00–0.50)

## 2021-03-30 NOTE — ED Notes (Signed)
Warm blankets given.

## 2021-03-30 NOTE — ED Notes (Signed)
Attempted IV x 1  Flushed easily without swelling but pt states it was hurting so it was removed

## 2021-03-30 NOTE — ED Notes (Signed)
Patient transported to X-ray 

## 2021-03-30 NOTE — ED Provider Notes (Signed)
[facility] EMERGENCY DEPARTMENT Provider Note   CSN: [id number] Arrival date & time: 03/30/21  0138     History Chief Complaint  Patient presents with  . Palpitations    Sheri Hodge is a 30 y.o. female.  Patient presents to the emergency department for evaluation of heart palpitations.  Patient reports that she has been experiencing intermittent palpitations for 1 week.  She did not really pay much attention to it until tonight.  Patient reports that she woke up with a nosebleed tonight.  This is unusual for her.  She denies trauma.  She has been experiencing some allergies and nasal congestion recently, however.  Patient reports that while she was dealing with a nosebleed she noticed increased heart rate.  Is also been noticing an intermittent sharp pain in the left temple area.        Past Medical History:  Diagnosis Date  . Exophthalmia   . Goiter   . Graves disease   . Graves disease   . H/O goiter   . Hyperthyroidism 2016  . Seizures [facility])     Patient Active Problem List   Diagnosis Date Noted  . Constipation 09/21/2017  . Low vitamin B12 level   . Hypothyroidism 09/20/2017  . Anemia 09/20/2017  . Trichomonas infection 09/20/2017  . Hypokalemia 09/20/2017    Past Surgical History:  Procedure Laterality Date  . THYROIDECTOMY       OB History   No obstetric history on file.     Family History  Problem Relation Age of Onset  . Diabetes Maternal Grandmother   . Diabetes Paternal Grandmother   . Hyperthyroidism Neg Hx     Social History   Tobacco Use  . Smoking status: Light Tobacco Smoker    Packs/day: 0.25    Types: Cigars  . Smokeless tobacco: Never Used  Vaping Use  . Vaping Use: Never used  Substance Use Topics  . Alcohol use: Yes    Comment: wine on the weekends  . Drug use: No    Home Medications Prior to Admission medications   Medication Sig Start Date End Date Taking? Authorizing Provider  albuterol (VENTOLIN HFA)  108 (90 Base) MCG/ACT inhaler Inhale 1-2 puffs into the lungs every 6 (six) hours as needed for wheezing or shortness of breath. 07/05/20   Henderly, Britni A, PA-C  cetirizine-pseudoephedrine (ZYRTEC-D) 5-120 MG tablet Take 1 tablet by mouth daily. 07/05/20   Henderly, Britni A, PA-C  doxycycline (VIBRAMYCIN) 100 MG capsule Take 1 capsule (100 mg total) by mouth 2 (two) times daily. One po bid x 7 days 11/01/19   Arthor Captain, PA-C  fluticasone [facility]) 50 MCG/ACT nasal spray Place 2 sprays into both nostrils daily. 07/05/20   Henderly, Britni A, PA-C  folic acid (FOLVITE) 1 MG tablet Take 1 tablet (1 mg total) daily by mouth. 09/23/17   Rodolph Bong, MD  ibuprofen (ADVIL,MOTRIN) 200 MG tablet Take 400 mg every 6 (six) hours as needed by mouth for moderate pain.    [provider]  levothyroxine (SYNTHROID, LEVOTHROID) 125 MCG tablet Take 1 tablet (125 mcg total) daily before breakfast by mouth. 09/22/17   Rodolph Bong, MD  polyethylene glycol [facility] Ethelene Hal) packet Take 17 g daily by mouth. 09/22/17   Rodolph Bong, MD  senna-docusate (SENOKOT-S) 8.6-50 MG tablet Take 1 tablet 2 (two) times daily by mouth. 09/22/17   Rodolph Bong, MD  vitamin B-12 (CYANOCOBALAMIN) 1000 MCG tablet Take 1 tablet (1,000  mcg total) daily by mouth. 09/22/17   Rodolph Bong, MD    Allergies    Latex  Review of Systems   Review of Systems  HENT: Positive for nosebleeds.   Cardiovascular: Positive for palpitations.  Neurological: Positive for headaches.  All other systems reviewed and are negative.   Physical Exam Updated Vital Signs BP 106/63 (BP Location: Left Arm)   Pulse 79   Temp 98.1 F (36.7 C) (Oral)   Resp (!) 23   Ht 5\' 6"  (1.676 m)   Wt 87.5 kg   LMP 03/27/2021 (Exact Date)   SpO2 98%   BMI 31.15 kg/m   Physical Exam Vitals and nursing note reviewed.  Constitutional:      General: She is not in acute distress.    Appearance: Normal appearance.  She is well-developed.  HENT:     Head: Normocephalic and atraumatic.     Right Ear: Hearing normal.     Left Ear: Hearing normal.     Nose: Nose normal.  Eyes:     Conjunctiva/sclera: Conjunctivae normal.     Pupils: Pupils are equal, round, and reactive to light.  Cardiovascular:     Rate and Rhythm: Regular rhythm.     Heart sounds: S1 normal and S2 normal. No murmur heard. No friction rub. No gallop.   Pulmonary:     Effort: Pulmonary effort is normal. No respiratory distress.     Breath sounds: Normal breath sounds.  Chest:     Chest wall: No tenderness.  Abdominal:     General: Bowel sounds are normal.     Palpations: Abdomen is soft.     Tenderness: There is no abdominal tenderness. There is no guarding or rebound. Negative signs include Murphy's sign and McBurney's sign.     Hernia: No hernia is present.  Musculoskeletal:        General: Normal range of motion.     Cervical back: Normal range of motion and neck supple.  Skin:    General: Skin is warm and dry.     Findings: No rash.  Neurological:     Mental Status: She is alert and oriented to person, place, and time.     GCS: GCS eye subscore is 4. GCS verbal subscore is 5. GCS motor subscore is 6.     Cranial Nerves: No cranial nerve deficit.     Sensory: No sensory deficit.     Coordination: Coordination normal.  Psychiatric:        Speech: Speech normal.        Behavior: Behavior normal.        Thought Content: Thought content normal.     ED Results / Procedures / Treatments   Labs (all labs ordered are listed, but only abnormal results are displayed) Labs Reviewed  CBC WITH DIFFERENTIAL/PLATELET - Abnormal; Notable for the following components:      Result Value   Hemoglobin 10.8 (*)    HCT 34.0 (*)    RDW 16.5 (*)    All other components within normal limits  BASIC METABOLIC PANEL - Abnormal; Notable for the following components:   Calcium 8.5 (*)    All other components within normal limits   D-DIMER, QUANTITATIVE  TSH  TROPONIN I (HIGH SENSITIVITY)  TROPONIN I (HIGH SENSITIVITY)    EKG EKG Interpretation  Date/Time:  Thursday Mar 30 2021 01:49:13 EDT Ventricular Rate:  102 PR Interval:  152 QRS Duration: 89 QT Interval:  341 QTC Calculation: 445  R Axis:   16 Text Interpretation: Sinus tachycardia Probable left atrial enlargement Confirmed by Gilda Crease [phone]) on 03/30/2021 2:10:01 AM   Radiology DG Chest 2 View  Result Date: 03/30/2021 CLINICAL DATA:  Chest discomfort.  Palpitations. EXAM: CHEST - 2 VIEW COMPARISON:  Chest x-ray 11/15/2020, CT chest 01/18/2017 FINDINGS: The heart size and mediastinal contours are within normal limits. No focal consolidation. No pulmonary edema. No pleural effusion. No pneumothorax. No acute osseous abnormality. IMPRESSION: No active cardiopulmonary disease. Electronically Signed   By: Tish Frederickson M.D.   On: 03/30/2021 02:51   CT HEAD WO CONTRAST  Result Date: 03/30/2021 CLINICAL DATA:  Headache, new or worsening, positional EXAM: CT HEAD WITHOUT CONTRAST TECHNIQUE: Contiguous axial images were obtained from the base of the skull through the vertex without intravenous contrast. COMPARISON:  05/03/2016 brain MRI FINDINGS: Brain: No evidence of acute infarction, hemorrhage, hydrocephalus, extra-axial collection or mass lesion/mass effect. Vascular: No hyperdense vessel or unexpected calcification. Skull: Normal. Negative for fracture or focal lesion. Sinuses/Orbits: No acute finding. IMPRESSION: Negative head CT. Electronically Signed   By: Marnee Spring M.D.   On: 03/30/2021 04:45    Procedures Procedures   Medications Ordered in ED Medications - No data to display  ED Course  I have reviewed the triage vital signs and the nursing notes.  Pertinent labs & imaging results that were available during my care of the patient were reviewed by me and considered in my medical decision making (see chart for details).     MDM Rules/Calculators/A&P                          Patient presents to the emergency department for evaluation of heart palpitations.  Patient reports that she has been noticing intermittent palpitations for approximately 1 week.  She had not paid much attention but it worsened tonight in the setting of being upset about having a nosebleed.  Nose exam is unremarkable.  Bleeding has stopped.  Basic labs normal including hemoglobin, platelets.  Patient with mild persistent tachycardia at presentation.  No shortness of breath.  D-dimer is negative.  Based on this and atypical nature, doubt PE.  Patient reports a history of thyroidectomy secondary to Graves' disease/goiter.  She is on thyroid replacement.  She reports that her numbers have been low when she has been titrating up her thyroid medication, last dose increase was a month ago.  TSH pending.  Tachycardia has resolved with rest here in the emergency department.  Doubt hyperthyroid.  Question some element of anxiety.  Patient with intermittent left-sided headache.  Patient very concerned about her headache, requesting CT head.  Imaging performed, no abnormality noted.  Final Clinical Impression(s) / ED Diagnoses Final diagnoses:  Heart palpitations    Rx / DC Orders ED Discharge Orders    None       Amalee Olsen, Canary Brim, MD 03/30/21 [phone]

## 2021-03-30 NOTE — ED Triage Notes (Signed)
Patient arrived via POV c/o palpitations waking her from sleep. Patient states nosebleed at that time that has resolved but endorses headache. Patient is AO x 4, VS WDL, normal gait.

## 2021-03-30 NOTE — ED Notes (Signed)
Pt requesting a head CT  States she keeps having a pounding sensation on one side of her head  States she discussed it with the doctor but has not heard what anyone is doing about it   Discussed with EDP

## 2021-05-04 ENCOUNTER — Other Ambulatory Visit: Payer: Self-pay

## 2021-05-04 DIAGNOSIS — Z8639 Personal history of other endocrine, nutritional and metabolic disease: Secondary | ICD-10-CM | POA: Insufficient documentation

## 2021-05-04 DIAGNOSIS — H0520 Unspecified exophthalmos: Secondary | ICD-10-CM | POA: Insufficient documentation

## 2021-05-04 DIAGNOSIS — R569 Unspecified convulsions: Secondary | ICD-10-CM | POA: Insufficient documentation

## 2021-05-04 DIAGNOSIS — E049 Nontoxic goiter, unspecified: Secondary | ICD-10-CM | POA: Insufficient documentation

## 2021-05-04 DIAGNOSIS — E0500 Thyrotoxicosis with diffuse goiter without thyrotoxic crisis or storm: Secondary | ICD-10-CM | POA: Insufficient documentation

## 2021-05-05 ENCOUNTER — Other Ambulatory Visit: Payer: Self-pay

## 2021-05-05 ENCOUNTER — Ambulatory Visit: Payer: BLUE CROSS/BLUE SHIELD | Admitting: Cardiology

## 2021-05-05 ENCOUNTER — Encounter: Payer: Self-pay | Admitting: Cardiology

## 2021-05-05 ENCOUNTER — Ambulatory Visit (INDEPENDENT_AMBULATORY_CARE_PROVIDER_SITE_OTHER): Payer: BLUE CROSS/BLUE SHIELD

## 2021-05-05 VITALS — BP 114/77 | HR 80 | Ht 65.0 in | Wt 192.0 lb

## 2021-05-05 DIAGNOSIS — R002 Palpitations: Secondary | ICD-10-CM

## 2021-05-05 DIAGNOSIS — Z720 Tobacco use: Secondary | ICD-10-CM | POA: Diagnosis not present

## 2021-05-05 DIAGNOSIS — E0500 Thyrotoxicosis with diffuse goiter without thyrotoxic crisis or storm: Secondary | ICD-10-CM

## 2021-05-05 DIAGNOSIS — R011 Cardiac murmur, unspecified: Secondary | ICD-10-CM

## 2021-05-05 HISTORY — DX: Palpitations: R00.2

## 2021-05-05 HISTORY — DX: Cardiac murmur, unspecified: R01.1

## 2021-05-05 NOTE — Patient Instructions (Signed)
Medication Instructions:  No medication changes. *If you need a refill on your cardiac medications before your next appointment, please call your pharmacy*   Lab Work: None ordered If you have labs (blood work) drawn today and your tests are completely normal, you will receive your results only by: MyChart Message (if you have MyChart) OR A paper copy in the mail If you have any lab test that is abnormal or we need to change your treatment, we will call you to review the results.   Testing/Procedures: Your physician has requested that you have an echocardiogram. Echocardiography is a painless test that uses sound waves to create images of your heart. It provides your doctor with information about the size and shape of your heart and how well your heart's chambers and valves are working. This procedure takes approximately one hour. There are no restrictions for this procedure.   WHY IS MY DOCTOR PRESCRIBING ZIO? The Zio system is proven and trusted by physicians to detect and diagnose irregular heart rhythms -- and has been prescribed to hundreds of thousands of patients.  The FDA has cleared the Zio system to monitor for many different kinds of irregular heart rhythms. In a study, physicians were able to reach a diagnosis 90% of the time with the Zio system1.  You can wear the Zio monitor -- a small, discreet, comfortable patch -- during your normal day-to-day activity, including while you sleep, shower, and exercise, while it records every single heartbeat for analysis.  1Barrett, P., et al. Comparison of 24 Hour Holter Monitoring Versus 14 Day Novel Adhesive Patch Electrocardiographic Monitoring. American Journal of Medicine, 2014.  ZIO VS. HOLTER MONITORING The Zio monitor can be comfortably worn for up to 14 days. Holter monitors can be worn for 24 to 48 hours, limiting the time to record any irregular heart rhythms you may have. Zio is able to capture data for the 51% of patients who  have their first symptom-triggered arrhythmia after 48 hours.1  LIVE WITHOUT RESTRICTIONS The Zio ambulatory cardiac monitor is a small, unobtrusive, and water-resistant patch--you might even forget you're wearing it. The Zio monitor records and stores every beat of your heart, whether you're sleeping, working out, or showering. Wear the monitor for 14 days, remove 05/19/21.  Follow-Up: At [facility], you and your health needs are our priority.  As part of our continuing mission to provide you with exceptional heart care, we have created designated Provider Care Teams.  These Care Teams include your primary Cardiologist (physician) and Advanced Practice Providers (APPs -  Physician Assistants and Nurse Practitioners) who all work together to provide you with the care you need, when you need it.  We recommend signing up for the patient portal called "MyChart".  Sign up information is provided on this After Visit Summary.  MyChart is used to connect with patients for Virtual Visits (Telemedicine).  Patients are able to view lab/test results, encounter notes, upcoming appointments, etc.  Non-urgent messages can be sent to your provider as well.   To learn more about what you can do with MyChart, go to [email or web address].    Your next appointment:   3 month(s)  The format for your next appointment:   In Person  Provider:   Belva Crome, MD   Other Instructions Echocardiogram An echocardiogram is a test that uses sound waves (ultrasound) to produce images of the heart. Images from an echocardiogram can provide important information about: Heart size and shape. The size and thickness and movement  of your heart's walls. Heart muscle function and strength. Heart valve function or if you have stenosis. Stenosis is when the heart valves are too narrow. If blood is flowing backward through the heart valves (regurgitation). A tumor or infectious growth around the heart valves. Areas of  heart muscle that are not working well because of poor blood flow or injury from a heart attack. Aneurysm detection. An aneurysm is a weak or damaged part of an artery wall. The wall bulges out from the normal force of blood pumping through the body. Tell a health care provider about: Any allergies you have. All medicines you are taking, including vitamins, herbs, eye drops, creams, and over-the-counter medicines. Any blood disorders you have. Any surgeries you have had. Any medical conditions you have. Whether you are pregnant or may be pregnant. What are the risks? Generally, this is a safe test. However, problems may occur, including an allergic reaction to dye (contrast) that may be used during the test. What happens before the test? No specific preparation is needed. You may eat and drink normally. What happens during the test? You will take off your clothes from the waist up and put on a hospital gown. Electrodes or electrocardiogram (ECG)patches may be placed on your chest. The electrodes or patches are then connected to a device that monitors your heart rate and rhythm. You will lie down on a table for an ultrasound exam. A gel will be applied to your chest to help sound waves pass through your skin. A handheld device, called a transducer, will be pressed against your chest and moved over your heart. The transducer produces sound waves that travel to your heart and bounce back (or "echo" back) to the transducer. These sound waves will be captured in real-time and changed into images of your heart that can be viewed on a video monitor. The images will be recorded on a computer and reviewed by your health care provider. You may be asked to change positions or hold your breath for a short time. This makes it easier to get different views or better views of your heart. In some cases, you may receive contrast through an IV in one of your veins. This can improve the quality of the pictures from  your heart. The procedure may vary among health care providers and hospitals.   What can I expect after the test? You may return to your normal, everyday life, including diet, activities, and medicines, unless your health care provider tells you not to do that. Follow these instructions at home: It is up to you to get the results of your test. Ask your health care provider, or the department that is doing the test, when your results will be ready. Keep all follow-up visits. This is important. Summary An echocardiogram is a test that uses sound waves (ultrasound) to produce images of the heart. Images from an echocardiogram can provide important information about the size and shape of your heart, heart muscle function, heart valve function, and other possible heart problems. You do not need to do anything to prepare before this test. You may eat and drink normally. After the echocardiogram is completed, you may return to your normal, everyday life, unless your health care provider tells you not to do that. This information is not intended to replace advice given to you by your health care provider. Make sure you discuss any questions you have with your health care provider. Document Revised: 06/14/2020 Document Reviewed: 06/14/2020 Elsevier Patient Education  [employer].

## 2021-05-05 NOTE — Progress Notes (Signed)
Cardiology Office Note:    Date:  05/05/2021   ID:  Sheri Hodge, DOB 04/26/1991, MRN [id number]  PCP:  [facility]  Cardiologist:  Garwin Brothers, MD   Referring MD: Gilda Crease,*    ASSESSMENT:    1. Murmur, cardiac   2. Palpitations   3. Graves disease   4. Tobacco use   5. Cardiac murmur    PLAN:    In order of problems listed above:  Primary prevention stressed with the patient.  Importance of compliance with diet medication stressed and she vocalized understanding.  She was advised to walk on a regular basis at least half a day and she promises to do so. Palpitations: TSH is noted.  Please see my note in detail below.  This is followed by primary care and endocrinologist.  Her TSH must be optimized and this should help her palpitation issues.  She is not on any beta-blocker or any such medications.  She is asymptomatic now.  Also blood pressure is borderline so I do not want to push into this medication if I do not have to.  We will do a 2-week monitoring to assess the symptoms. Cardiac murmur: Echocardiogram will be done to assess murmur heard on auscultation.  Obesity: Weight reduction was stressed and diet was emphasized and she promises to do better. Cigarette smoker: I spent 5 minutes with the patient discussing solely about smoking. Smoking cessation was counseled. I suggested to the patient also different medications and pharmacological interventions. Patient is keen to try stopping on its own at this time. He will get back to me if he needs any further assistance in this matter.  Patient will be seen in follow-up appointment in 6 months or earlier if the patient has any concerns.   Medication Adjustments/Labs and Tests Ordered: Current medicines are reviewed at length with the patient today.  Concerns regarding medicines are outlined above.  Orders Placed This Encounter  Procedures   LONG TERM MONITOR (3-14 DAYS)   EKG 12-Lead    ECHOCARDIOGRAM COMPLETE   No orders of the defined types were placed in this encounter.    History of Present Illness:    Sheri Hodge is a 30 y.o. female who is being seen today for the evaluation of palpitations at the request of Gilda Crease,*.  Patient has past medical history of Graves' disease.  She denies any problems at this time.  She occasionally experiences palpitations for which she is sent here.  I reviewed her TSH and she was hypothyroid in the past.  I believe she has received replacement medications and her thyroid medicine may be more than what she needs now.  Her TSH is now suppressed and she experiences palpitations.  She tells me that the medication adjustment has been done and she feels better.  No chest pain orthopnea or PND.  At the time of my evaluation, the patient is alert awake oriented and in no distress.  She does not exercise on a regular basis but is involved in dance and that gives her aerobic activity.  No history of syncopal spells.  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 Overview:  Massive goiter removed 01/11/2017   Formatting of this note might be different from the  original. Massive goiter removed 01/11/2017 Formatting of this note might be different from the original. 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   fluticasone (FLONASE) 50 MCG/ACT nasal spray Place 2 sprays into both nostrils as needed for allergies or rhinitis.   ibuprofen (ADVIL,MOTRIN) 200 MG tablet Take 400 mg every 6 (six) hours as needed by mouth for moderate pain.   levothyroxine (SYNTHROID) 175 MCG tablet Take 175 mcg by mouth daily before breakfast.   polyethylene glycol (MIRALAX MIX-IN PAX) 17 g packet Take 17 g by mouth as needed for mild constipation.   senna-docusate (SENOKOT-S) 8.6-50 MG tablet Take 1 tablet by mouth as needed for mild constipation.   vitamin B-12 (CYANOCOBALAMIN) 1000 MCG tablet Take 1 tablet (1,000 mcg total) daily by mouth.   Vitamin D, Ergocalciferol, (DRISDOL) 1.25 MG (50000 UNIT) CAPS capsule Take 50,000 Units by mouth once a week.     Allergies:   Latex and Tomato   Social History   Socioeconomic History   Marital status: Single    Spouse name: Not on file   Number of children: Not on file   Years of education: Not on file   Highest education level: Not on file  Occupational History   Not on file  Tobacco Use   Smoking status: Light Smoker    Packs/day: 0.25    Pack years: 0.00    Types: Cigars, Cigarettes   Smokeless tobacco: Never  Vaping Use    Vaping Use: Never used  Substance and Sexual Activity   Alcohol use: Yes    Comment: wine on the weekends   Drug use: No   Sexual activity: Yes  Other Topics Concern   Not on file  Social History Narrative   Not on file   Social Determinants of Health   Financial Resource Strain: Not on file  Food Insecurity: Not on file  Transportation Needs: Not on file  Physical Activity: Not on file  Stress: Not on file  Social Connections: Not on file     Family History: The patient's family history includes Diabetes in her maternal grandmother and paternal grandmother. There is no history of Hyperthyroidism.  ROS:   Please see the history of present illness.    All other systems reviewed and are negative.  EKGs/Labs/Other Studies Reviewed:    The following studies were reviewed today: EKG reveals sinus rhythm and nonspecific ST-T changes.   Recent Labs: 03/30/2021: BUN 11; Creatinine, Ser 0.62; Hemoglobin 10.8; Platelets 231; Potassium 3.5; Sodium 138; TSH 0.026  Recent Lipid Panel No results found for: CHOL, TRIG, HDL, CHOLHDL, VLDL, LDLCALC, LDLDIRECT  Physical Exam:    VS:  BP 114/77   Pulse 80   Ht 5\' 5"  (1.651 m)   Wt 192 lb (87.1 kg)   SpO2 99%   BMI 31.95 kg/m     Wt Readings from Last 3 Encounters:  05/05/21 192 lb (87.1 kg)  03/30/21 193 lb (87.5 kg)  07/05/20 196 lb (88.9 kg)     GEN: Patient is in no acute distress HEENT: Normal NECK: No JVD; No carotid bruits LYMPHATICS: No lymphadenopathy CARDIAC: S1 S2 regular, 2/6 systolic murmur at the apex. RESPIRATORY:  Clear to auscultation without rales, wheezing or rhonchi  ABDOMEN: Soft, non-tender, non-distended MUSCULOSKELETAL:  No edema; No deformity  SKIN: Warm and dry NEUROLOGIC:  Alert and oriented x 3 PSYCHIATRIC:  Normal affect    Signed, 07/07/20, MD  05/05/2021 3:38 PM    [facility]

## 2021-05-24 ENCOUNTER — Ambulatory Visit (HOSPITAL_BASED_OUTPATIENT_CLINIC_OR_DEPARTMENT_OTHER): Payer: BLUE CROSS/BLUE SHIELD

## 2021-06-27 ENCOUNTER — Encounter (HOSPITAL_BASED_OUTPATIENT_CLINIC_OR_DEPARTMENT_OTHER): Payer: Self-pay | Admitting: *Deleted

## 2021-06-27 ENCOUNTER — Emergency Department (HOSPITAL_BASED_OUTPATIENT_CLINIC_OR_DEPARTMENT_OTHER)
Admission: EM | Admit: 2021-06-27 | Discharge: 2021-06-27 | Disposition: A | Discharge status: Home / Self Care | Payer: BLUE CROSS/BLUE SHIELD | Attending: Emergency Medicine | Admitting: Emergency Medicine

## 2021-06-27 ENCOUNTER — Other Ambulatory Visit: Payer: Self-pay

## 2021-06-27 ENCOUNTER — Emergency Department (HOSPITAL_BASED_OUTPATIENT_CLINIC_OR_DEPARTMENT_OTHER): Payer: BLUE CROSS/BLUE SHIELD

## 2021-06-27 DIAGNOSIS — U071 COVID-19: Secondary | ICD-10-CM

## 2021-06-27 DIAGNOSIS — F17210 Nicotine dependence, cigarettes, uncomplicated: Secondary | ICD-10-CM | POA: Diagnosis not present

## 2021-06-27 DIAGNOSIS — R0602 Shortness of breath: Secondary | ICD-10-CM | POA: Diagnosis present

## 2021-06-27 DIAGNOSIS — Z79899 Other long term (current) drug therapy: Secondary | ICD-10-CM | POA: Insufficient documentation

## 2021-06-27 DIAGNOSIS — E890 Postprocedural hypothyroidism: Secondary | ICD-10-CM | POA: Diagnosis not present

## 2021-06-27 DIAGNOSIS — Z91040 Latex allergy status: Secondary | ICD-10-CM | POA: Insufficient documentation

## 2021-06-27 LAB — RESP PANEL BY RT-PCR (FLU A&B, COVID) ARPGX2
Influenza A by PCR: NEGATIVE
Influenza B by PCR: NEGATIVE
SARS Coronavirus 2 by RT PCR: POSITIVE — AB

## 2021-06-27 NOTE — ED Provider Notes (Signed)
[facility] EMERGENCY DEPARTMENT Provider Note   CSN: [id number] Arrival date & time: 06/27/21  1940     History Chief Complaint  Patient presents with   Covid Positive    Sheri Hodge is a 30 y.o. female with past medical history of anemia, hypothyroidism s/p thyroidectomy on Synthroid presenting today with a complaint of 2 days of diarrhea, chest pain and shortness of breath.  Reports a positive at home.  Was seen at [facility] earlier today however left AMA due to wait time.  Says that she has not taken her temperature but feels as though she has had a fever.  Reports that she came to the hospital today because she was concerned about the chest pains and shortness of breath.  No leg swelling. no estrogen use or recent travel.  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 Overview:  Massive goiter removed 01/11/2017   Formatting of this note might be different from the original. Massive goiter removed 01/11/2017 Formatting of this note might be different from the original. 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   Tobacco use 01/02/2017   Trichomonas infection 09/20/2017   Uninsured 05/09/2016   Vitamin D deficiency 04/30/2019    Patient Active Problem List   Diagnosis Date Noted   Palpitations 05/05/2021   Cardiac murmur 05/05/2021   Exophthalmia 05/04/2021   Goiter 05/04/2021   Graves disease 05/04/2021   H/O goiter 05/04/2021   Seizures (HCC) 05/04/2021   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   Sleep disorder breathing 12/08/2019   Frequent headaches 04/30/2019   Vitamin D deficiency 04/30/2019   Menorrhagia with regular cycle 03/22/2018   Constipation 09/21/2017   Low vitamin B12 level    Hypothyroidism 09/20/2017   Anemia 09/20/2017   Trichomonas infection 09/20/2017   Hypokalemia 09/20/2017   Iron deficiency anemia secondary to inadequate dietary iron intake 09/20/2017   H/O noncompliance with medical treatment, presenting hazards to health 07/20/2017   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     Family History  Problem Relation Age of Onset   Diabetes Maternal Grandmother    Diabetes Paternal Grandmother    Hyperthyroidism Neg Hx     Social History   Tobacco Use   Smoking status: Light Smoker    Packs/day: 0.25     Types: Cigars, Cigarettes   Smokeless tobacco: Never  Vaping Use   Vaping Use: Never used  Substance Use Topics   Alcohol use: Yes    Comment: wine on the weekends   Drug use: No    Home Medications Prior to Admission medications   Medication Sig Start Date End Date Taking? Authorizing Provider  levothyroxine (SYNTHROID) 175 MCG tablet Take 175 mcg by mouth daily before breakfast.   Yes [provider]  albuterol (VENTOLIN HFA) 108 (90 Base) MCG/ACT inhaler Inhale 1-2 puffs into the lungs every 6 (six) hours as needed for wheezing or shortness of breath. 07/05/20   Henderly, Britni A, PA-C  fluticasone (FLONASE) 50 MCG/ACT nasal spray Place 2 sprays into both nostrils as needed for allergies or rhinitis.    [provider]  ibuprofen (ADVIL,MOTRIN) 200 MG tablet Take 400 mg every 6 (six) hours as needed by mouth for moderate pain.    [provider]  polyethylene glycol (MIRALAX MIX-IN PAX) 17 g packet Take 17 g by mouth as needed for mild constipation.    [provider]  senna-docusate (SENOKOT-S) 8.6-50 MG tablet Take 1 tablet by mouth as needed for mild constipation.    [provider]  vitamin B-12 (CYANOCOBALAMIN) 1000 MCG tablet Take 1 tablet (1,000 mcg total) daily by mouth. 09/22/17   Rodolph Bong, MD  Vitamin D, Ergocalciferol, (DRISDOL) 1.25 MG (50000 UNIT) CAPS capsule Take 50,000 Units by mouth once a week. 03/09/21   [provider]    Allergies    Latex and Tomato  Review of Systems   Review of Systems  Constitutional:  Positive for chills and fever.  HENT:  Positive for congestion. Negative for rhinorrhea.   Respiratory:  Positive for chest tightness and shortness of breath.   Cardiovascular:  Positive for chest pain. Negative for palpitations.  Gastrointestinal:  Positive for diarrhea. Negative for abdominal pain.  Musculoskeletal:  Negative for arthralgias and neck pain.  Skin:  Negative for rash.   Neurological:  Negative for dizziness, syncope and light-headedness.  All other systems reviewed and are negative.  Physical Exam Updated Vital Signs BP 121/87 (BP Location: Right Arm)   Pulse 68   Temp 98.6 F (37 C) (Oral)   Resp 18   Ht 5\' 5"  (1.651 m)   Wt 87.1 kg   LMP 06/23/2021   SpO2 100%   BMI 31.95 kg/m   Physical Exam Vitals and nursing note reviewed.  Constitutional:      Appearance: Normal appearance.  HENT:     Head: Normocephalic and atraumatic.     Mouth/Throat:     Mouth: Mucous membranes are moist.     Pharynx: Oropharynx is clear.  Eyes:     General: No scleral icterus.    Extraocular Movements: Extraocular movements intact.     Conjunctiva/sclera: Conjunctivae normal.     Pupils: Pupils are equal, round, and reactive to light.  Cardiovascular:     Rate and Rhythm:  Normal rate and regular rhythm.  Pulmonary:     Effort: Pulmonary effort is normal. No respiratory distress.     Breath sounds: Normal breath sounds.  Abdominal:     General: Abdomen is flat.     Tenderness: There is no abdominal tenderness.  Musculoskeletal:     Cervical back: Normal range of motion. No rigidity.  Lymphadenopathy:     Cervical: No cervical adenopathy.  Skin:    Findings: No rash.  Neurological:     Mental Status: She is alert.  Psychiatric:        Mood and Affect: Mood normal.    ED Results / Procedures / Treatments   Labs (all labs ordered are listed, but only abnormal results are displayed) Labs Reviewed  RESP PANEL BY RT-PCR (FLU A&B, COVID) ARPGX2 - Abnormal; Notable for the following components:      Result Value   SARS Coronavirus 2 by RT PCR POSITIVE (*)    All other components within normal limits    EKG EKG Interpretation  Date/Time:  Tuesday June 27 2021 20:02:56 EDT Ventricular Rate:  67 PR Interval:  162 QRS Duration: 86 QT Interval:  491 QTC Calculation: 519 R Axis:   96 Text Interpretation: Sinus rhythm Borderline right axis  deviation Borderline T wave abnormalities Prolonged QT interval No significant change since last tracing Confirmed by Alvira Monday ([id number]) on 06/27/2021 9:02:19 PM  Radiology DG Chest Portable 1 View  Result Date: 06/27/2021 CLINICAL DATA:  Chest pain and shortness of breath. Chest tightness for 2 days. Diarrhea. COVID exposure. EXAM: PORTABLE CHEST 1 VIEW COMPARISON:  06/27/2021 FINDINGS: Slightly shallow inspiration. The heart size and mediastinal contours are within normal limits. Both lungs are clear. The visualized skeletal structures are unremarkable. IMPRESSION: No active disease. Electronically Signed   By: Burman Nieves M.D.   On: 06/27/2021 20:27    Procedures Procedures   Medications Ordered in ED Medications - No data to display  ED Course  I have reviewed the triage vital signs and the nursing notes.  Pertinent labs & imaging results that were available during my care of the patient were reviewed by me and considered in my medical decision making (see chart for details).    MDM Rules/Calculators/A&P Patient presenting due to chest pain, shortness of breath after a home test positive COVID 19.  She had a known exposure and notes that she has COVID however she is concerned due to her chest pains and breathing troubles.  I believe symptoms likely due to COVID illness however I obtained an EKG to rule out ACS.  I also ordered chest x-ray to be sure that the patient does not have a pneumonia, pneumothorax, hemothorax, pulmonary edema. CXR negative. Not suspicious for pericarditis or myocarditis due to normal vital signs and nature of Hx/PE.  Patient and I discussed current CDC recommendations for isolation.  We also discussed at home care.  She voiced understanding.  She requested that I pulled a potassium level however I told her that she did not need this due to a normal potassium level this morning at [facility].  Patient agreeable to discharge with work note.  Final  Clinical Impression(s) / ED Diagnoses Final diagnoses:  COVID-19    Rx / DC Orders Results and diagnoses were explained to the patient. Return precautions discussed in full. Patient had no additional questions and expressed complete understanding.     Woodroe Chen 06/27/21 [id number]    Alvira Monday, MD 06/28/21  [id number]  

## 2021-06-27 NOTE — ED Triage Notes (Signed)
Chest tightness x 2 days. Diarrhea. Covid exposure. She feels she has Covid.

## 2021-09-05 ENCOUNTER — Ambulatory Visit: Payer: BLUE CROSS/BLUE SHIELD | Admitting: Cardiology

## 2021-09-07 ENCOUNTER — Encounter: Payer: Self-pay | Admitting: Cardiology

## 2021-09-07 ENCOUNTER — Ambulatory Visit: Payer: BLUE CROSS/BLUE SHIELD | Admitting: Cardiology

## 2021-09-07 ENCOUNTER — Other Ambulatory Visit: Payer: Self-pay

## 2021-09-07 VITALS — BP 120/70 | HR 86 | Ht 65.6 in | Wt 192.1 lb

## 2021-09-07 DIAGNOSIS — R011 Cardiac murmur, unspecified: Secondary | ICD-10-CM

## 2021-09-07 DIAGNOSIS — R002 Palpitations: Secondary | ICD-10-CM | POA: Diagnosis not present

## 2021-09-07 DIAGNOSIS — E039 Hypothyroidism, unspecified: Secondary | ICD-10-CM | POA: Diagnosis not present

## 2021-09-07 NOTE — Progress Notes (Signed)
Cardiology Office Note:    Date:  09/07/2021   ID:  Sheri Hodge, DOB [birth year], MRN [id number]  PCP:  [facility], [facility]  Cardiologist:  Garwin Brothers, MD   Referring MD: [facility], [facility]*    ASSESSMENT:    1. Murmur, cardiac   2. Acquired hypothyroidism   3. Cardiac murmur   4. Palpitations    PLAN:    In order of problems listed above:  Primary prevention stressed with the patient.  Importance of compliance with diet medication stressed and she vocalized understanding.  I told her to very meticulously take her thyroid supplementation and she promises to do so.  This is followed by primary care. Palpitations: Event monitor report mentioned below and is overall unremarkable.  I discussed this with her at length and questions were answered to her satisfaction. Obesity: Weight reduction was stressed and diet was emphasized.  I told her to walk at least half an hour a day 5 days a week and she promises to do better Cardiac murmur: Echocardiogram will be done to assess murmur heard on auscultation. Patient will be seen in follow-up appointment in 6 months or earlier if the patient has any concerns    Medication Adjustments/Labs and Tests Ordered: Current medicines are reviewed at length with the patient today.  Concerns regarding medicines are outlined above.  Orders Placed This Encounter  Procedures   ECHOCARDIOGRAM COMPLETE    No orders of the defined types were placed in this encounter.    No chief complaint on file.    History of Present Illness:    Sheri Hodge is a 30 y.o. female.  Patient has past medical history of Graves' disease event and now hypothyroidism.  Patient mentions to me that she missed taking her medications for several weeks and subsequently now she takes her medications and has just begun to take it regularly.  She is followed by primary care providers for this.  She denies any chest pain orthopnea or PND.  She tells me that her  palpitations have gotten better.  At the time of my evaluation, the patient is alert awake oriented and in no distress.  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  At the time of my evaluation, the patient is alert awake oriented and in no distress.  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 Overview:  Massive goiter removed 01/11/2017   Formatting of this note might be different from the original. Massive goiter  removed 01/11/2017 Formatting of this note might be different from the original. 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   fluticasone (FLONASE) 50 MCG/ACT nasal spray Place 2 sprays into both nostrils as needed for allergies or rhinitis.   ibuprofen (ADVIL,MOTRIN) 200 MG tablet Take 400 mg every 6 (six) hours as needed by mouth for moderate pain.   levothyroxine (SYNTHROID) 175 MCG tablet Take 175 mcg by mouth daily before breakfast.   polyethylene glycol (MIRALAX / GLYCOLAX) 17 g packet Take 17 g by mouth as needed for mild constipation.   senna-docusate (SENOKOT-S) 8.6-50 MG tablet Take 1 tablet by mouth as needed for mild constipation.   vitamin B-12 (CYANOCOBALAMIN) 1000 MCG tablet Take 1 tablet (1,000 mcg total) daily by mouth.   Vitamin D, Ergocalciferol, (DRISDOL) 1.25 MG (50000 UNIT) CAPS capsule Take 50,000 Units by mouth once a week.   XULANE 150-35 MCG/24HR transdermal patch Place 1 patch onto the skin once a week.     Allergies:   Latex and Tomato   Social History   Socioeconomic History   Marital status: Single    Spouse name: Not on file   Number of children: Not on file   Years of education: Not on file   Highest education level: Not on file  Occupational History   Not on file  Tobacco Use   Smoking status: Light Smoker    Packs/day: 0.25    Types: Cigars, Cigarettes   Smokeless tobacco: Never  Vaping Use   Vaping Use: Never used  Substance and Sexual Activity   Alcohol use: Yes    Comment: wine on the weekends   Drug use: No   Sexual activity: Yes  Other Topics Concern   Not on file  Social History Narrative   Not on file   Social Determinants of Health   Financial Resource Strain: Not on file  Food Insecurity: Not on file  Transportation Needs: Not on file  Physical Activity: Not on file  Stress: Not on file  Social Connections: Not on file     Family History: The  patient's family history includes Diabetes in her maternal grandmother and paternal grandmother. There is no history of Hyperthyroidism.  ROS:   Please see the history of present illness.    All other systems reviewed and are negative.  EKGs/Labs/Other Studies Reviewed:    The following studies were reviewed today: I discussed my findings with the patient at length the event monitor report.  Patch Wear Time:  14 days and 0 hours (2022-07-01T15:44:57-0400 to 2022-07-15T15:45:11-398)   Patient had a min HR of 50 bpm, max HR of 159 bpm, and avg HR of 80 bpm.    Predominant underlying rhythm was Sinus Rhythm. Bundle Branch Block/IVCD was present. 1 run of Supraventricular Tachycardia occurred lasting 23.8 secs with a max rate of 130 bpm (avg 126  bpm). Some episodes of Supraventricular Tachycardia conducted with possible aberrancy. Supraventricular Tachycardia was detected within +/- 45 seconds of symptomatic patient event(s). Isolated SVEs were rare (<1.0%), SVE Triplets were rare  (<1.0%), and no SVE Couplets were present.    Isolated VEs were rare (<1.0%), and no VE Couplets or VE Triplets were present.   Impression: Mildly abnormal event monitor.  Only 1 run of brief arrhythmia noted as mentioned above.  Medical management.  If these recur significantly in the future we may consider medications.   Recent Labs: 03/30/2021: BUN 11; Creatinine, Ser 0.62; Hemoglobin 10.8; Platelets 231; Potassium 3.5; Sodium 138; TSH 0.026  Recent Lipid Panel No results found for: CHOL, TRIG, HDL, CHOLHDL, VLDL, LDLCALC, LDLDIRECT  Physical Exam:    VS:  BP 120/70   Pulse 86   Ht 5' 5.6" (1.666 m)   Wt 192 lb 1.3 oz (87.1 kg)   SpO2 99%   BMI 31.38 kg/m     Wt Readings from Last 3 Encounters:  09/07/21 192 lb 1.3 oz (87.1 kg)  06/27/21 192 lb 0.3 oz (87.1 kg)  05/05/21 192 lb (87.1 kg)     GEN: Patient is in no acute distress HEENT: Normal NECK: No JVD; No carotid bruits LYMPHATICS: No  lymphadenopathy CARDIAC: Hear sounds regular, 2/6 systolic murmur at the apex. RESPIRATORY:  Clear to auscultation without rales, wheezing or rhonchi  ABDOMEN: Soft, non-tender, non-distended MUSCULOSKELETAL:  No edema; No deformity  SKIN: Warm and dry NEUROLOGIC:  Alert and oriented x 3 PSYCHIATRIC:  Normal affect   Signed, Garwin Brothers, MD  09/07/2021 2:49 PM    [facility]

## 2021-09-07 NOTE — Patient Instructions (Signed)
Medication Instructions:  °Your physician recommends that you continue on your current medications as directed. Please refer to the Current Medication list given to you today. ° °*If you need a refill on your cardiac medications before your next appointment, please call your pharmacy* ° ° °Lab Work: °None ordered °If you have labs (blood work) drawn today and your tests are completely normal, you will receive your results only by: °MyChart Message (if you have MyChart) OR °A paper copy in the mail °If you have any lab test that is abnormal or we need to change your treatment, we will call you to review the results. ° ° °Testing/Procedures: °Your physician has requested that you have an echocardiogram. Echocardiography is a painless test that uses sound waves to create images of your heart. It provides your doctor with information about the size and shape of your heart and how well your heart’s chambers and valves are working. This procedure takes approximately one hour. There are no restrictions for this procedure. ° ° ° °Follow-Up: °At [facility], you and your health needs are our priority.  As part of our continuing mission to provide you with exceptional heart care, we have created designated Provider Care Teams.  These Care Teams include your primary Cardiologist (physician) and Advanced Practice Providers (APPs -  Physician Assistants and Nurse Practitioners) who all work together to provide you with the care you need, when you need it. ° °We recommend signing up for the patient portal called "MyChart".  Sign up information is provided on this After Visit Summary.  MyChart is used to connect with patients for Virtual Visits (Telemedicine).  Patients are able to view lab/test results, encounter notes, upcoming appointments, etc.  Non-urgent messages can be sent to your provider as well.   °To learn more about what you can do with MyChart, go to [email or web address].   ° °Your next appointment:   °6  month(s) ° °The format for your next appointment:   °In Person ° °Provider:   °Rajan Revankar, MD ° ° °Other Instructions °Echocardiogram °An echocardiogram is a test that uses sound waves (ultrasound) to produce images of the heart. °Images from an echocardiogram can provide important information about: °Heart size and shape. °The size and thickness and movement of your heart's walls. °Heart muscle function and strength. °Heart valve function or if you have stenosis. Stenosis is when the heart valves are too narrow. °If blood is flowing backward through the heart valves (regurgitation). °A tumor or infectious growth around the heart valves. °Areas of heart muscle that are not working well because of poor blood flow or injury from a heart attack. °Aneurysm detection. An aneurysm is a weak or damaged part of an artery wall. The wall bulges out from the normal force of blood pumping through the body. °Tell a health care provider about: °Any allergies you have. °All medicines you are taking, including vitamins, herbs, eye drops, creams, and over-the-counter medicines. °Any blood disorders you have. °Any surgeries you have had. °Any medical conditions you have. °Whether you are pregnant or may be pregnant. °What are the risks? °Generally, this is a safe test. However, problems may occur, including an allergic reaction to dye (contrast) that may be used during the test. °What happens before the test? °No specific preparation is needed. You may eat and drink normally. °What happens during the test? °You will take off your clothes from the waist up and put on a hospital gown. °Electrodes or electrocardiogram (ECG)patches may be placed on   your chest. The electrodes or patches are then connected to a device that monitors your heart rate and rhythm. °You will lie down on a table for an ultrasound exam. A gel will be applied to your chest to help sound waves pass through your skin. °A handheld device, called a transducer, will  be pressed against your chest and moved over your heart. The transducer produces sound waves that travel to your heart and bounce back (or "echo" back) to the transducer. These sound waves will be captured in real-time and changed into images of your heart that can be viewed on a video monitor. The images will be recorded on a computer and reviewed by your health care provider. °You may be asked to change positions or hold your breath for a short time. This makes it easier to get different views or better views of your heart. °In some cases, you may receive contrast through an IV in one of your veins. This can improve the quality of the pictures from your heart. °The procedure may vary among health care providers and hospitals.   °What can I expect after the test? °You may return to your normal, everyday life, including diet, activities, and medicines, unless your health care provider tells you not to do that. °Follow these instructions at home: °It is up to you to get the results of your test. Ask your health care provider, or the department that is doing the test, when your results will be ready. °Keep all follow-up visits. This is important. °Summary °An echocardiogram is a test that uses sound waves (ultrasound) to produce images of the heart. °Images from an echocardiogram can provide important information about the size and shape of your heart, heart muscle function, heart valve function, and other possible heart problems. °You do not need to do anything to prepare before this test. You may eat and drink normally. °After the echocardiogram is completed, you may return to your normal, everyday life, unless your health care provider tells you not to do that. °This information is not intended to replace advice given to you by your health care provider. Make sure you discuss any questions you have with your health care provider. °Document Revised: 06/14/2020 Document Reviewed: 06/14/2020 °Elsevier Patient  Education © 2021 [employer] ° ° °

## 2021-09-27 ENCOUNTER — Ambulatory Visit (HOSPITAL_BASED_OUTPATIENT_CLINIC_OR_DEPARTMENT_OTHER): Payer: BLUE CROSS/BLUE SHIELD

## 2021-12-11 DIAGNOSIS — E538 Deficiency of other specified B group vitamins: Secondary | ICD-10-CM

## 2021-12-11 HISTORY — DX: Deficiency of other specified B group vitamins: E53.8

## 2022-01-09 ENCOUNTER — Other Ambulatory Visit: Payer: Self-pay

## 2022-01-09 ENCOUNTER — Encounter (HOSPITAL_BASED_OUTPATIENT_CLINIC_OR_DEPARTMENT_OTHER): Payer: Self-pay | Admitting: Emergency Medicine

## 2022-01-09 ENCOUNTER — Emergency Department (HOSPITAL_BASED_OUTPATIENT_CLINIC_OR_DEPARTMENT_OTHER): Payer: BLUE CROSS/BLUE SHIELD

## 2022-01-09 ENCOUNTER — Emergency Department (HOSPITAL_BASED_OUTPATIENT_CLINIC_OR_DEPARTMENT_OTHER)
Admission: EM | Admit: 2022-01-09 | Discharge: 2022-01-10 | Disposition: A | Discharge status: Home / Self Care | Payer: BLUE CROSS/BLUE SHIELD | Attending: Emergency Medicine | Admitting: Emergency Medicine

## 2022-01-09 DIAGNOSIS — M25551 Pain in right hip: Secondary | ICD-10-CM | POA: Diagnosis not present

## 2022-01-09 DIAGNOSIS — M542 Cervicalgia: Secondary | ICD-10-CM | POA: Insufficient documentation

## 2022-01-09 DIAGNOSIS — E039 Hypothyroidism, unspecified: Secondary | ICD-10-CM | POA: Diagnosis not present

## 2022-01-09 DIAGNOSIS — R079 Chest pain, unspecified: Secondary | ICD-10-CM | POA: Insufficient documentation

## 2022-01-09 DIAGNOSIS — M79601 Pain in right arm: Secondary | ICD-10-CM | POA: Insufficient documentation

## 2022-01-09 DIAGNOSIS — Z20822 Contact with and (suspected) exposure to covid-19: Secondary | ICD-10-CM | POA: Diagnosis not present

## 2022-01-09 DIAGNOSIS — F17210 Nicotine dependence, cigarettes, uncomplicated: Secondary | ICD-10-CM | POA: Insufficient documentation

## 2022-01-09 DIAGNOSIS — R52 Pain, unspecified: Secondary | ICD-10-CM

## 2022-01-09 DIAGNOSIS — M791 Myalgia, unspecified site: Secondary | ICD-10-CM | POA: Diagnosis not present

## 2022-01-09 NOTE — ED Triage Notes (Signed)
Pt is c/o chest pain   Pt states it hurts worse when she breathes  Pt states the chest pain started on Sunday  Pt states the chest pain is on the left side and feels like a squeezing pain    ?

## 2022-01-10 LAB — RESP PANEL BY RT-PCR (FLU A&B, COVID) ARPGX2
Influenza A by PCR: NEGATIVE
Influenza B by PCR: NEGATIVE
SARS Coronavirus 2 by RT PCR: NEGATIVE

## 2022-01-10 NOTE — ED Provider Notes (Signed)
?[place] [facility] ?[facility] Emergency Department ?Provider Note ?MRN:  [id number]  ?Arrival date & time: 01/10/22    ? ?Chief Complaint   ?Chest Pain ?  ?History of Present Illness   ?Sheri Hodge is a 31 y.o. year-old female with a history of Graves' disease, conversion disorder presenting to the ED with chief complaint of chest pain. ? ?Patient is endorsing right-sided neck pain, right arm pain, left-sided chest pain, right hip pain for the past few days.  Feeling generally achy with some cough.  Requesting COVID test. ? ?Review of Systems  ?A thorough review of systems was obtained and all systems are negative except as noted in the HPI and PMH.  ? ?Patient's Health History   ? ?Past Medical History:  ?Diagnosis Date  ? Abnormal weight loss 12/02/2015  ? Allergy to pollen 09/11/2016  ? Anemia 09/20/2017  ? Cardiac murmur 05/05/2021  ? Constipation 09/21/2017  ? Conversion disorder 05/03/2016  ? Exophthalmia   ? Frequent headaches 04/30/2019  ? Goiter   ? Graves disease   ? Graves disease   ? 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 H/O goiter   ? H/O noncompliance with medical treatment, presenting hazards to health 07/20/2017  ? High risk medication use 12/29/2015  ? Hyperthyroidism 2016  ? Hypokalemia 09/20/2017  ? Hypothyroidism 09/20/2017  ? Iron deficiency anemia secondary to inadequate dietary iron intake 09/20/2017  ? Low vitamin B12 level   ? Menorrhagia with regular cycle 03/22/2018  ? Nasal congestion 09/13/2016  ? Noncompliance 05/10/2016  ? Palpitations 05/05/2021  ? Postoperative hypothyroidism 02/04/2017  ? Formatting of this note might be different from the original. Overview:  Massive goiter removed 01/11/2017   Formatting of this note might be different from the original. Massive goiter removed 01/11/2017 Formatting of this note might be different from the original. Massive goiter removed 01/11/2017  ? 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 Quality of voice, hoarse 09/13/2016  ? 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  ?  Seizures ([place])   ? Sleep disorder breathing 12/08/2019  ? Tachycardia 12/02/2015  ? 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 Tobacco use 01/02/2017  ? Trichomonas infection 09/20/2017  ? Uninsured 05/09/2016  ? Vitamin D deficiency 04/30/2019  ?  ?Past Surgical History:  ?Procedure Laterality Date  ? THYROIDECTOMY    ?  ?Family History  ?Problem Relation Age of Onset  ? Diabetes Maternal Grandmother   ? Diabetes Paternal Grandmother   ? Hyperthyroidism Neg Hx   ?  ?Social History  ? ?Socioeconomic History  ? Marital status: Single  ?  Spouse name: Not on file  ? Number of children: Not on file  ? Years of education: Not on file  ? Highest education level: Not on file  ?Occupational History  ? Not on file  ?Tobacco Use  ? Smoking status: Light Smoker  ?  Packs/day: 0.25  ?  Types: Cigars, Cigarettes  ? Smokeless tobacco: Never  ?Vaping Use  ? Vaping Use: Never used  ?Substance and Sexual Activity  ? Alcohol use: Not Currently  ?  Comment: wine on the weekends  ? Drug use: Not Currently  ? Sexual activity: Yes  ?Other Topics Concern  ? Not on file  ?Social History Narrative  ? Not on file  ? ?Social Determinants of Health  ? ?Financial Resource Strain: Not on file  ?Food Insecurity: Not on file  ?Transportation Needs: Not on file  ?Physical Activity: Not on file  ?Stress: Not on file  ?Social Connections: Not  on file  ?Intimate Partner Violence: Not on file  ?  ? ?Physical Exam  ? ?Vitals:  ? 01/09/22 2330 01/10/22 0030  ?BP: 102/70 112/71  ?Pulse: (!) 53 (!) 51  ?Resp: 16 18  ?Temp:    ?SpO2: 97% 100%  ?  ?CONSTITUTIONAL: Well-appearing, NAD ?NEURO/PSYCH:  Alert and oriented x 3, no focal deficits ?EYES:  eyes equal and reactive ?ENT/NECK:  no LAD, no JVD ?CARDIO: Regular rate, well-perfused, normal S1 and S2 ?PULM:  CTAB no wheezing or rhonchi ?GI/GU:  non-distended, non-tender ?MSK/SPINE:  No gross deformities, no edema ?SKIN:  no rash, atraumatic ? ? ?*Additional and/or pertinent findings included in MDM below ? ?Diagnostic and  Interventional Summary  ? ? EKG Interpretation ? ?Date/Time:  Tuesday January 09 2022 22:03:14 EST ?Ventricular Rate:  68 ?PR Interval:  156 ?QRS Duration: 92 ?QT Interval:  382 ?QTC Calculation: 406 ?R Axis:   -67 ?Text Interpretation: Normal sinus rhythm Left axis deviation Nonspecific T wave abnormality Abnormal ECG When compared with ECG of 27-Jun-2021 20:02, PREVIOUS ECG IS PRESENT Confirmed by Gerlene Fee [id number]) on 01/10/2022 12:05:56 AM ?  ? ?  ? ?Labs Reviewed  ?RESP PANEL BY RT-PCR (FLU A&B, COVID) [id number]  ?  ?DG Chest Port 1 View  ?Final Result  ?  ?  ?Medications - No data to display  ? ?Procedures  /  Critical Care ?Procedures ? ?ED Course and Medical Decision Making  ?Initial Impression and Ddx ?Patient is resting comfortably on my initial assessment, vital signs normal, no increased work of breathing.  Lungs are clear on my exam, no abdominal tenderness.  Overall doubt emergent process, highly doubt pulmonary embolism.  Given the chest pain obtaining screening EKG and chest x-ray.  Favoring more of a musculoskeletal pain or viral body aches type pain.  Anticipating discharge. ? ?Past medical/surgical history that increases complexity of ED encounter: None ? ?Interpretation of Diagnostics ?I personally reviewed the Chest Xray and my interpretation is as follows: No obvious pneumothorax or opacities ?   ? ?EKG is reassuring, COVID-negative ? ?Patient Reassessment and Ultimate Disposition/Management ?Discharge home ? ?Patient management required discussion with the following services or consulting groups:  None ? ?Complexity of Problems Addressed ?Acute complicated illness or Injury ? ?Additional Data Reviewed and Analyzed ?Further history obtained from: ?None ? ?Additional Factors Impacting ED Encounter Risk ?None ? ?Barth Kirks. Sedonia Small, MD ?[facility] ?[place] ?[email or web address] ? ?Final Clinical Impressions(s) / ED Diagnoses  ? ?  ICD-10-CM   ?1. Body aches  R52   ?   ?2. Chest pain, unspecified type  R07.9   ?  ?  ?ED Discharge Orders   ? ? None  ? ?  ?  ? ?Discharge Instructions Discussed with and Provided to Patient:  ? ? ? ?Discharge Instructions   ? ?  ?You were evaluated in the Emergency Department and after careful evaluation, we did not find any emergent condition requiring admission or further testing in the hospital. ? ?Your exam/testing today was overall reassuring.  EKG and chest x-ray are normal.  COVID/flu test is negative.  Suspect symptoms related to some other viral illness.  Recommend Tylenol or Motrin at home and rest. ? ?Please return to the Emergency Department if you experience any worsening of your condition.  Thank you for allowing [place] to be a part of your care. ? ? ? ? ? ?  ?Maudie Flakes, MD ?01/10/22 0105 ? ?

## 2022-01-10 NOTE — Discharge Instructions (Signed)
You were evaluated in the Emergency Department and after careful evaluation, we did not find any emergent condition requiring admission or further testing in the hospital. ? ?Your exam/testing today was overall reassuring.  EKG and chest x-ray are normal.  COVID/flu test is negative.  Suspect symptoms related to some other viral illness.  Recommend Tylenol or Motrin at home and rest. ? ?Please return to the Emergency Department if you experience any worsening of your condition.  Thank you for allowing [place] to be a part of your care. ? ?

## 2022-03-20 ENCOUNTER — Ambulatory Visit: Payer: BLUE CROSS/BLUE SHIELD | Admitting: Cardiology

## 2023-01-03 ENCOUNTER — Other Ambulatory Visit: Payer: Self-pay

## 2023-01-03 ENCOUNTER — Emergency Department (HOSPITAL_BASED_OUTPATIENT_CLINIC_OR_DEPARTMENT_OTHER)
Admission: EM | Admit: 2023-01-03 | Discharge: 2023-01-03 | Disposition: A | Discharge status: Home / Self Care | Payer: BC Managed Care – PPO | Attending: Emergency Medicine | Admitting: Emergency Medicine

## 2023-01-03 ENCOUNTER — Encounter (HOSPITAL_BASED_OUTPATIENT_CLINIC_OR_DEPARTMENT_OTHER): Payer: Self-pay | Admitting: Emergency Medicine

## 2023-01-03 ENCOUNTER — Emergency Department (HOSPITAL_BASED_OUTPATIENT_CLINIC_OR_DEPARTMENT_OTHER): Payer: BC Managed Care – PPO

## 2023-01-03 DIAGNOSIS — Z8616 Personal history of COVID-19: Secondary | ICD-10-CM | POA: Diagnosis not present

## 2023-01-03 DIAGNOSIS — R799 Abnormal finding of blood chemistry, unspecified: Secondary | ICD-10-CM | POA: Insufficient documentation

## 2023-01-03 DIAGNOSIS — Z1152 Encounter for screening for COVID-19: Secondary | ICD-10-CM | POA: Diagnosis not present

## 2023-01-03 DIAGNOSIS — Z91040 Latex allergy status: Secondary | ICD-10-CM | POA: Diagnosis not present

## 2023-01-03 DIAGNOSIS — R079 Chest pain, unspecified: Secondary | ICD-10-CM | POA: Diagnosis present

## 2023-01-03 DIAGNOSIS — R5383 Other fatigue: Secondary | ICD-10-CM | POA: Insufficient documentation

## 2023-01-03 LAB — BASIC METABOLIC PANEL
Anion gap: 5 (ref 5–15)
BUN: 8 mg/dL (ref 6–20)
CO2: 22 mmol/L (ref 22–32)
Calcium: 8 mg/dL — ABNORMAL LOW (ref 8.9–10.3)
Chloride: 104 mmol/L (ref 98–111)
Creatinine, Ser: 0.83 mg/dL (ref 0.44–1.00)
GFR, Estimated: >60 mL/min (ref >60)
Glucose, Bld: 98 mg/dL (ref 70–99)
Potassium: 3.4 mmol/L — ABNORMAL LOW (ref 3.5–5.1)
Sodium: 131 mmol/L — ABNORMAL LOW (ref 135–145)

## 2023-01-03 LAB — CBC
HCT: 36.6 % (ref 36.0–46.0)
Hemoglobin: 13 g/dL (ref 12.0–15.0)
MCH: 34 pg (ref 26.0–34.0)
MCHC: 35.5 g/dL (ref 30.0–36.0)
MCV: 95.8 fL (ref 80.0–100.0)
Platelets: 215 10*3/uL (ref 150–400)
RBC: 3.82 MIL/uL — ABNORMAL LOW (ref 3.87–5.11)
RDW: 12.9 % (ref 11.5–15.5)
WBC: 6 10*3/uL (ref 4.0–10.5)
nRBC: 0 % (ref 0.0–0.2)

## 2023-01-03 LAB — RESP PANEL BY RT-PCR (RSV, FLU A&B, COVID)  RVPGX2
Influenza A by PCR: NEGATIVE
Influenza B by PCR: NEGATIVE
Resp Syncytial Virus by PCR: NEGATIVE
SARS Coronavirus 2 by RT PCR: NEGATIVE

## 2023-01-03 LAB — TROPONIN I (HIGH SENSITIVITY): Troponin I (High Sensitivity): <2 ng/L (ref <18)

## 2023-01-03 LAB — TSH: TSH: 44.059 u[IU]/mL — ABNORMAL HIGH (ref 0.350–4.500)

## 2023-01-03 NOTE — ED Notes (Signed)
Discharge paperwork reviewed entirely with patient, including Rx's and follow up care. Pain was under control. Pt verbalized understanding as well as all parties involved. No questions or concerns voiced at the time of discharge. No acute distress noted.   Pt ambulated out to PVA without incident or assistance.

## 2023-01-03 NOTE — ED Triage Notes (Signed)
Pt arrives pov, steady gait with referral from pcp r/t abnormal TSH labs. Pt also c/o LT side CP with shob x 3 days.

## 2023-01-03 NOTE — ED Notes (Signed)
Contacted [facility]. Advised concern for hyperthyroid at >'2mg'$  of consumption. Per pt's admission, she consumed 10x of her 178mg, and is under threshold. No current concern per Poison Control. Advised to contact her PCP and followup in 3-4 days.

## 2023-01-03 NOTE — ED Provider Notes (Signed)
[facility] Provider Note   CSN: [id number] Arrival date & time: 01/03/23  0930     History  Chief Complaint  Patient presents with   Chest Pain   Abnormal Labs    Sheri Hodge is a 32 y.o. female.  32 yo F with a chief complaint of episodic crampy chest pain.  Occurs mostly to the left side and sometimes goes down the left arm.  Not exertional.  Denies cough congestion or fever.  Denies trauma.  Denies abdominal pain nausea or vomiting.  She had been fairly fatigued over the past week.  Not been taking her levothyroxine.  She had appointment with her family doctor and was found to have an elevated TSH.  They called her back and she then took 10 of her tablets.  She called her doctor and they encouraged her to come to the ED for evaluation.   Chest Pain      Home Medications Prior to Admission medications   Medication Sig Start Date End Date Taking? Authorizing Provider  albuterol (VENTOLIN HFA) 108 (90 Base) MCG/ACT inhaler Inhale 1-2 puffs into the lungs every 6 (six) hours as needed for wheezing or shortness of breath. 07/05/20   Henderly, Britni A, PA-C  fluticasone (FLONASE) 50 MCG/ACT nasal spray Place 2 sprays into both nostrils as needed for allergies or rhinitis.    [provider]  ibuprofen (ADVIL,MOTRIN) 200 MG tablet Take 400 mg every 6 (six) hours as needed by mouth for moderate pain.    [provider]  levothyroxine (SYNTHROID) 175 MCG tablet Take 175 mcg by mouth daily before breakfast.    [provider]  polyethylene glycol (MIRALAX / GLYCOLAX) 17 g packet Take 17 g by mouth as needed for mild constipation.    [provider]  senna-docusate (SENOKOT-S) 8.6-50 MG tablet Take 1 tablet by mouth as needed for mild constipation.    [provider]  vitamin B-12 (CYANOCOBALAMIN) 1000 MCG tablet Take 1 tablet (1,000 mcg total) daily by mouth. 09/22/17   Eugenie Filler, MD   Vitamin D, Ergocalciferol, (DRISDOL) 1.25 MG (50000 UNIT) CAPS capsule Take 50,000 Units by mouth once a week. 03/09/21   [provider]  Marilu Favre 150-35 MCG/24HR transdermal patch Place 1 patch onto the skin once a week. 08/14/21   [provider]      Allergies    Latex and Tomato    Review of Systems   Review of Systems  Cardiovascular:  Positive for chest pain.    Physical Exam Updated Vital Signs BP 121/79   Pulse 61   Temp 98.6 F (37 C) (Oral)   Resp 16   Ht '5\' 5"'$  (1.651 m)   Wt 87.5 kg   LMP 12/28/2022   SpO2 99%   BMI 32.12 kg/m  Physical Exam Vitals and nursing note reviewed.  Constitutional:      General: She is not in acute distress.    Appearance: She is well-developed. She is not diaphoretic.  HENT:     Head: Normocephalic and atraumatic.  Eyes:     Pupils: Pupils are equal, round, and reactive to light.  Cardiovascular:     Rate and Rhythm: Normal rate and regular rhythm.     Heart sounds: No murmur heard.    No friction rub. No gallop.  Pulmonary:     Effort: Pulmonary effort is normal.     Breath sounds: No wheezing or rales.  Abdominal:  General: There is no distension.     Palpations: Abdomen is soft.     Tenderness: There is no abdominal tenderness.  Musculoskeletal:        General: No tenderness.     Cervical back: Normal range of motion and neck supple.  Skin:    General: Skin is warm and dry.  Neurological:     Mental Status: She is alert and oriented to person, place, and time.  Psychiatric:        Behavior: Behavior normal.     ED Results / Procedures / Treatments   Labs (all labs ordered are listed, but only abnormal results are displayed) Labs Reviewed  BASIC METABOLIC PANEL - Abnormal; Notable for the following components:      Result Value   Sodium 131 (*)    Potassium 3.4 (*)    Calcium 8.0 (*)    All other components within normal limits  CBC - Abnormal; Notable for the following components:   RBC  3.82 (*)    All other components within normal limits  RESP PANEL BY RT-PCR (RSV, FLU A&B, COVID)  RVPGX2  PREGNANCY, URINE  TSH  TROPONIN I (HIGH SENSITIVITY)  TROPONIN I (HIGH SENSITIVITY)    EKG EKG Interpretation  Date/Time:  Thursday January 03 2023 09:41:06 EST Ventricular Rate:  77 PR Interval:  139 QRS Duration: 91 QT Interval:  400 QTC Calculation: 453 R Axis:   -36 Text Interpretation: Sinus rhythm Left axis deviation Borderline T abnormalities, lateral leads No significant change since last tracing Confirmed by Deno Etienne [phone]) on 01/03/2023 10:17:08 AM  Radiology DG Chest 2 View  Result Date: 01/03/2023 CLINICAL DATA:  Chest pain EXAM: CHEST - 2 VIEW COMPARISON:  01/09/2022 FINDINGS: The heart size and mediastinal contours are within normal limits. Both lungs are clear. No pneumothorax or pleural effusion. The visualized skeletal structures are unremarkable. IMPRESSION: No active cardiopulmonary disease. Electronically Signed   By: Sammie Bench M.D.   On: 01/03/2023 10:08    Procedures Procedures    Medications Ordered in ED Medications - No data to display  ED Course/ Medical Decision Making/ A&P                             Medical Decision Making Amount and/or Complexity of Data Reviewed Labs: ordered. Radiology: ordered.   32 yo F with a chief complaint of feeling fatigued.  She tells me that she has been taking her levothyroxine at home.  Seems like the cause of most of her symptoms.  She went to her family doctor's office and they checked a TSH and found it to be surprisingly high.  They then called her and she took 10 of her levothyroxine tablets.  This was discussed with poison control and felt to be below a dose that required monitoring.  She is not febrile she is not tachycardic has no signs consistent with hyperthyroid.  She was also complaining of chest pain.  This is atypical in nature.  Chest x-ray independently interpreted by me without focal  infiltrate or pneumothorax.  She has a mild hyponatremia.  No anemia.  Troponin negative.  PERC negative.  Symptoms for 3 days.  No significant change in the past 6 hours.  Not feel a delta is warranted.  Will discharge the patient home.  PCP follow-up.   12:10 PM:  I have discussed the diagnosis/risks/treatment options with the patient.  Evaluation and diagnostic testing in  the emergency department does not suggest an emergent condition requiring admission or immediate intervention beyond what has been performed at this time.  They will follow up with PCP. We also discussed returning to the ED immediately if new or worsening sx occur. We discussed the sx which are most concerning (e.g., sudden worsening pain, fever, inability to tolerate by mouth) that necessitate immediate return. Medications administered to the patient during their visit and any new prescriptions provided to the patient are listed below.  Medications given during this visit Medications - No data to display   The patient appears reasonably screen and/or stabilized for discharge and I doubt any other medical condition or other [facility] requiring further screening, evaluation, or treatment in the ED at this time prior to discharge.          Final Clinical Impression(s) / ED Diagnoses Final diagnoses:  Nonspecific chest pain    Rx / DC Orders ED Discharge Orders     None         Deno Etienne, DO 01/03/23 1210

## 2023-01-03 NOTE — Discharge Instructions (Signed)
Follow up with your doctor in the office. Return for worsening symptoms.   Try pepcid or tagamet up to twice a day.  Try to avoid things that may make this worse, most commonly these are spicy foods tomato based products fatty foods chocolate and peppermint.  Alcohol and tobacco can also make this worse.  Return to the emergency department for sudden worsening pain fever or inability to eat or drink.

## 2023-01-03 NOTE — ED Notes (Signed)
Pt advised she took 10 of her Levothyroxine at 0630hrs.

## 2023-03-23 ENCOUNTER — Ambulatory Visit
Admission: EM | Admit: 2023-03-23 | Discharge: 2023-03-23 | Disposition: A | Discharge status: Home / Self Care | Payer: BC Managed Care – PPO | Attending: Internal Medicine | Admitting: Internal Medicine

## 2023-03-23 ENCOUNTER — Ambulatory Visit: Payer: Self-pay

## 2023-03-23 DIAGNOSIS — N644 Mastodynia: Secondary | ICD-10-CM | POA: Diagnosis present

## 2023-03-23 DIAGNOSIS — Z3202 Encounter for pregnancy test, result negative: Secondary | ICD-10-CM | POA: Diagnosis present

## 2023-03-23 DIAGNOSIS — Z113 Encounter for screening for infections with a predominantly sexual mode of transmission: Secondary | ICD-10-CM | POA: Diagnosis present

## 2023-03-23 DIAGNOSIS — N898 Other specified noninflammatory disorders of vagina: Secondary | ICD-10-CM

## 2023-03-23 DIAGNOSIS — N6452 Nipple discharge: Secondary | ICD-10-CM | POA: Insufficient documentation

## 2023-03-23 LAB — POCT URINE PREGNANCY: Preg Test, Ur: NEGATIVE

## 2023-03-23 NOTE — Discharge Instructions (Signed)
Pregnancy test was negative.  Vaginal swab is pending.  We will call if it is abnormal.  I have sent over your information to the breast imaging center in [facility] for you to have imaging.  If they do not call you in the next 48 to 72 hours, please call them yourself at provided contact information.  Also recommend that you follow-up with gynecology for further evaluation and management of breast tenderness.

## 2023-03-23 NOTE — ED Triage Notes (Signed)
Pt c/o vaginal discharge, breast tenderness, right breast clear discharge   Onset ~ 1 week ago   Requesting sti screening

## 2023-03-23 NOTE — ED Provider Notes (Signed)
[facility]    CSN: [id number] Arrival date & time: 03/23/23  1315      History   Chief Complaint Chief Complaint  Patient presents with   Vaginal Discharge    HPI Sheri Hodge is a 32 y.o. female.   Patient presents with several different chief complaints today.  Patient reports that she has been having some intermittent bilateral breast tenderness over the past week or so.  Reports some white milky discharge from the right nipple intermittently.  Breast pain is generalized and she has not noticed any lumps or bumps.  Denies that she is currently breast-feeding a child or is currently pregnant.  Last menstrual cycle completed 1 week ago but patient reports that it was a brown colored blood and only lasted a few days.  Patient reports that she has been going to a gynecologist given that her menstrual cycles have been very irregular and she will bleed for approximately 1 month.  She reports that they started her on NuvaRing which has altered her cycles slightly and she does not have monthly menstrual cycles.  Also reporting some white vaginal discharge that started a few days prior.  Patient denies any exposure to STD but has had unprotected intercourse.  She would like STD testing to test for STDs causing vaginal discharge today.   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 Overview:  Massive goiter removed 01/11/2017   Formatting of this note might be different from the original. Massive goiter removed 01/11/2017 Formatting of this note might be different from the original. 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   Tobacco use 01/02/2017   Trichomonas infection 09/20/2017   Uninsured 05/09/2016   Vitamin D deficiency 04/30/2019    Patient Active Problem List   Diagnosis Date Noted   B12 deficiency 12/11/2021   Palpitations 05/05/2021   Cardiac murmur 05/05/2021   Exophthalmia 05/04/2021   Goiter 05/04/2021   Graves disease 05/04/2021   H/O goiter 05/04/2021   Seizures (HCC) 05/04/2021   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   Sleep disorder breathing 12/08/2019   Frequent headaches 04/30/2019   Vitamin D deficiency 04/30/2019   Menorrhagia with regular cycle 03/22/2018   Constipation 09/21/2017   Low vitamin B12 level    Hypothyroidism 09/20/2017   Anemia  09/20/2017   Trichomonas infection 09/20/2017   Hypokalemia 09/20/2017   Iron deficiency anemia secondary to inadequate dietary iron intake 09/20/2017   H/O noncompliance with medical treatment, presenting hazards to health 07/20/2017   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      Home Medications    Prior to Admission medications   Medication Sig Start Date End Date Taking? Authorizing Provider  albuterol (VENTOLIN HFA) 108 (90 Base) MCG/ACT inhaler Inhale 1-2 puffs into the lungs every 6 (six) hours as needed for wheezing or shortness of breath. 07/05/20   Henderly, Britni A, PA-C  fluticasone (FLONASE) 50 MCG/ACT nasal spray Place 2 sprays into both nostrils as needed for allergies or rhinitis.    [provider]  ibuprofen (ADVIL,MOTRIN) 200 MG tablet Take 400 mg every 6 (six) hours as needed by mouth for moderate pain.    [provider]  levothyroxine (SYNTHROID) 175 MCG tablet Take 175 mcg by mouth daily before breakfast.    [provider]  polyethylene glycol (MIRALAX / GLYCOLAX) 17 g packet Take 17 g by mouth as needed for mild constipation.    [provider]  senna-docusate (SENOKOT-S) 8.6-50 MG tablet Take 1 tablet by mouth as needed for mild constipation.    [provider]  vitamin B-12 (CYANOCOBALAMIN) 1000 MCG tablet Take 1 tablet (1,000 mcg total) daily by mouth. 09/22/17   Rodolph Bong, MD  Vitamin D, Ergocalciferol, (DRISDOL) 1.25 MG (50000 UNIT) CAPS capsule Take 50,000 Units by mouth once a week. 03/09/21   [provider]  Burr Medico 150-35 MCG/24HR transdermal patch Place 1 patch onto the skin once a week. 08/14/21   [provider]    Family History Family History  Problem Relation Age of Onset   Diabetes Maternal Grandmother    Diabetes Paternal Grandmother    Hyperthyroidism  Neg Hx     Social History Social History   Tobacco Use   Smoking status: Light Smoker    Packs/day: .25    Types: Cigars, Cigarettes   Smokeless tobacco: Never  Vaping Use   Vaping Use: Never used  Substance Use Topics   Alcohol use: Not Currently    Comment: wine on the weekends   Drug use: Not Currently     Allergies   Latex and Tomato   Review of Systems Review of Systems Per HPI  Physical Exam Triage Vital Signs ED Triage Vitals [03/23/23 1425]  Enc Vitals Group     BP (!) 142/88     Pulse Rate 70     Resp 16     Temp 98.7 F (37.1 C)     Temp Source Oral     SpO2 96 %     Weight      Height      Head Circumference      Peak Flow      Pain Score 0     Pain Loc  Pain Edu?      Excl. in GC?    No data found.  Updated Vital Signs BP (!) 142/88 (BP Location: Right Arm)   Pulse 70   Temp 98.7 F (37.1 C) (Oral)   Resp 16   SpO2 96%   Visual Acuity Right Eye Distance:   Left Eye Distance:   Bilateral Distance:    Right Eye Near:   Left Eye Near:    Bilateral Near:     Physical Exam Exam conducted with a chaperone present.  Constitutional:      General: She is not in acute distress.    Appearance: Normal appearance. She is not toxic-appearing or diaphoretic.  HENT:     Head: Normocephalic and atraumatic.  Eyes:     Extraocular Movements: Extraocular movements intact.     Conjunctiva/sclera: Conjunctivae normal.  Pulmonary:     Effort: Pulmonary effort is normal.  Chest:     Comments: Patient reports tenderness to palpation throughout bilateral breasts.  There is no obvious discoloration, swelling, lumps noted.  Nipples appear normal with no obvious discharge. Genitourinary:    Comments: Deferred with shared decision making.  Self swab performed. Neurological:     General: No focal deficit present.     Mental Status: She is alert and oriented to person, place, and time. Mental status is at baseline.  Psychiatric:        Mood and  Affect: Mood normal.        Behavior: Behavior normal.        Thought Content: Thought content normal.        Judgment: Judgment normal.      UC Treatments / Results  Labs (all labs ordered are listed, but only abnormal results are displayed) Labs Reviewed  POCT URINE PREGNANCY  CERVICOVAGINAL ANCILLARY ONLY    EKG   Radiology No results found.  Procedures Procedures (including critical care time)  Medications Ordered in UC Medications - No data to display  Initial Impression / Assessment and Plan / UC Course  I have reviewed the triage vital signs and the nursing notes.  Pertinent labs & imaging results that were available during my care of the patient were reviewed by me and considered in my medical decision making (see chart for details).     1.  Breast tenderness and nipple discharge  I am not sure exact etiology of patient's breast pain.  There is no obvious signs of infection or lumps noted on physical exam.  Will send patient to have breast imaging to ensure no worrisome etiologies are present.  Patient's information was faxed over to breast imaging center.  Advised patient that if they do not call her within next 48 to 72 hours, she is to call them herself to schedule the appointment.  Advise follow-up with gynecology for breast tenderness as well.  Urine pregnancy test was negative.  2.  Vaginal discharge  Cervicovaginal swab pending.  Given no exposure to STD, will await results for further treatment.  Patient advised to refrain from sexual activity until test results and treatment are complete.  Urine pregnancy test was negative.  UA deferred given no urinary symptoms.  Patient verbalized understanding and was agreeable with plan. Final Clinical Impressions(s) / UC Diagnoses   Final diagnoses:  Nipple discharge  Breast tenderness  Vaginal discharge  Screening examination for venereal disease  Urine pregnancy test negative     Discharge Instructions       Pregnancy test was negative.  Vaginal swab is pending.  We will call if it is abnormal.  I have sent over your information to the breast imaging center in [facility] for you to have imaging.  If they do not call you in the next 48 to 72 hours, please call them yourself at provided contact information.  Also recommend that you follow-up with gynecology for further evaluation and management of breast tenderness.    ED Prescriptions   None    PDMP not reviewed this encounter.   Gustavus Bryant, [place] 03/23/23 [phone]

## 2023-03-26 LAB — CERVICOVAGINAL ANCILLARY ONLY
Bacterial Vaginitis (gardnerella): NEGATIVE
Candida Glabrata: POSITIVE — AB
Candida Vaginitis: NEGATIVE
Chlamydia: NEGATIVE
Comment: NEGATIVE
Comment: NEGATIVE
Comment: NEGATIVE
Comment: NEGATIVE
Comment: NEGATIVE
Comment: NORMAL
Neisseria Gonorrhea: NEGATIVE
Trichomonas: NEGATIVE

## 2023-03-27 ENCOUNTER — Telehealth (HOSPITAL_COMMUNITY): Payer: Self-pay | Admitting: Emergency Medicine

## 2023-03-27 ENCOUNTER — Other Ambulatory Visit (HOSPITAL_BASED_OUTPATIENT_CLINIC_OR_DEPARTMENT_OTHER): Payer: Self-pay

## 2023-03-27 ENCOUNTER — Other Ambulatory Visit: Payer: Self-pay | Admitting: Internal Medicine

## 2023-03-27 DIAGNOSIS — N6452 Nipple discharge: Secondary | ICD-10-CM

## 2023-03-27 DIAGNOSIS — N644 Mastodynia: Secondary | ICD-10-CM

## 2023-03-27 MED ORDER — FLUCONAZOLE 150 MG PO TABS: 150.0000 mg | ORAL_TABLET | Freq: Once | ORAL | 0 refills | Status: AC

## 2023-03-27 MED ORDER — FLUCONAZOLE 150 MG PO TABS
150.0000 mg | ORAL_TABLET | Freq: Once | ORAL | 0 refills | Status: DC
  Filled 2023-03-27: qty 2, 2d supply, fill #0

## 2023-03-27 NOTE — Telephone Encounter (Signed)
Resending to pharmacy of patient's choice

## 2023-03-27 NOTE — Telephone Encounter (Signed)
Diflucan for positive yeast, see result for more info

## 2023-08-03 ENCOUNTER — Emergency Department (HOSPITAL_BASED_OUTPATIENT_CLINIC_OR_DEPARTMENT_OTHER)
Admission: EM | Admit: 2023-08-03 | Discharge: 2023-08-03 | Disposition: A | Discharge status: Home / Self Care | Payer: BC Managed Care – PPO | Attending: Emergency Medicine | Admitting: Emergency Medicine

## 2023-08-03 ENCOUNTER — Encounter (HOSPITAL_BASED_OUTPATIENT_CLINIC_OR_DEPARTMENT_OTHER): Payer: Self-pay | Admitting: Emergency Medicine

## 2023-08-03 ENCOUNTER — Other Ambulatory Visit: Payer: Self-pay

## 2023-08-03 DIAGNOSIS — Z91040 Latex allergy status: Secondary | ICD-10-CM | POA: Diagnosis not present

## 2023-08-03 DIAGNOSIS — M62838 Other muscle spasm: Secondary | ICD-10-CM | POA: Diagnosis not present

## 2023-08-03 DIAGNOSIS — R1310 Dysphagia, unspecified: Secondary | ICD-10-CM | POA: Diagnosis present

## 2023-08-03 DIAGNOSIS — R070 Pain in throat: Secondary | ICD-10-CM

## 2023-08-03 MED ORDER — LIDOCAINE VISCOUS HCL 2 % MT SOLN
15.0000 mL | Freq: Once | OROMUCOSAL | Status: AC
  Administered 2023-08-03: 15 mL via OROMUCOSAL
  Filled 2023-08-03: qty 15

## 2023-08-03 NOTE — Discharge Instructions (Signed)
You can take Mucinex, available over-the-counter cording to label instructions.

## 2023-08-03 NOTE — ED Triage Notes (Signed)
Pt states got a cramp in throat that woke her up. Now unable to swallow. No respiratory distress in triage.

## 2023-08-03 NOTE — ED Provider Notes (Signed)
[facility] Provider Note   CSN: [id number] Arrival date & time: 08/03/23  0865     History  Chief Complaint  Patient presents with   Oral Swelling    Sheri Hodge is a 32 y.o. female.  The history is provided by the patient.  Livana Hodge is a 32 y.o. female who presents to the Emergency Department complaining of trouble swallowing.  She is status post total thyroidectomy several years ago.  She reports intermittent episodes with a muscle spasm in her neck where it makes it difficult for her to swallow.  She has had intermittent episodes for several years.  She did have some increased nasal congestion yesterday and this morning woke with difficulty swallowing.  She did gargle and there was green mucus and some blood mixed in.  No epistaxis.  No difficulty breathing.      Home Medications Prior to Admission medications   Medication Sig Start Date End Date Taking? Authorizing Provider  albuterol (VENTOLIN HFA) 108 (90 Base) MCG/ACT inhaler Inhale 1-2 puffs into the lungs every 6 (six) hours as needed for wheezing or shortness of breath. 07/05/20   Henderly, Britni A, PA-C  fluticasone (FLONASE) 50 MCG/ACT nasal spray Place 2 sprays into both nostrils as needed for allergies or rhinitis.    [provider]  ibuprofen (ADVIL,MOTRIN) 200 MG tablet Take 400 mg every 6 (six) hours as needed by mouth for moderate pain.    [provider]  levothyroxine (SYNTHROID) 175 MCG tablet Take 175 mcg by mouth daily before breakfast.    [provider]  polyethylene glycol (MIRALAX / GLYCOLAX) 17 g packet Take 17 g by mouth as needed for mild constipation.    [provider]  senna-docusate (SENOKOT-S) 8.6-50 MG tablet Take 1 tablet by mouth as needed for mild constipation.    [provider]  vitamin B-12 (CYANOCOBALAMIN) 1000 MCG tablet Take 1 tablet (1,000 mcg total) daily by mouth. 09/22/17   Rodolph Bong, MD  Vitamin D, Ergocalciferol, (DRISDOL) 1.25 MG (50000 UNIT) CAPS capsule Take 50,000 Units by mouth once a week. 03/09/21   [provider]  Burr Medico 150-35 MCG/24HR transdermal patch Place 1 patch onto the skin once a week. 08/14/21   [provider]      Allergies    Latex and Tomato    Review of Systems   Review of Systems  All other systems reviewed and are negative.   Physical Exam Updated Vital Signs BP 125/89 (BP Location: Right Arm)   Pulse 72   Temp 98.1 F (36.7 C) (Oral)   Resp 16   Ht 5\' 5"  (1.651 m)   Wt 90.3 kg   LMP 06/12/2023 (Approximate)   SpO2 99%   BMI 33.12 kg/m  Physical Exam Vitals and nursing note reviewed.  Constitutional:      Appearance: She is well-developed.  HENT:     Head: Normocephalic and atraumatic.     Comments: No significant erythema or edema in the posterior oropharynx Cardiovascular:     Rate and Rhythm: Normal rate and regular rhythm.     Heart sounds: No murmur heard. Pulmonary:     Effort: Pulmonary effort is normal. No respiratory distress.     Breath sounds: Normal breath sounds. No stridor.  Musculoskeletal:        General: No tenderness.     Cervical back: Normal range of motion and neck supple. No tenderness.  Lymphadenopathy:  Cervical: No cervical adenopathy.  Skin:    General: Skin is warm and dry.  Neurological:     Mental Status: She is alert and oriented to person, place, and time.  Psychiatric:        Behavior: Behavior normal.     ED Results / Procedures / Treatments   Labs (all labs ordered are listed, but only abnormal results are displayed) Labs Reviewed - No data to display  EKG None  Radiology No results found.  Procedures Procedures    Medications Ordered in ED Medications  lidocaine (XYLOCAINE) 2 % viscous mouth solution 15 mL (15 mLs Mouth/Throat Given 08/03/23 1610)    ED Course/ Medical Decision Making/ A&P                                 Medical Decision  Making Risk Prescription drug management.   Patient status thyroidectomy here for evaluation of muscle spasm in her neck, difficulty swallowing.  She is well-hydrated on examination and nontoxic-appearing with no respiratory distress.  No voice change or stridor.  There are no soft tissue masses on examination.  No significant edema or erythema in the posterior oropharynx.  She was treated with viscous lidocaine with improvement in her throat discomfort.  She does have some nasal discharge on the right, suspect some of her nasal discharge is contributing to her discomfort.  Discussed supportive care at home.  Discussed outpatient follow-up and return precautions.  Presentation is not consistent with pharyngeal foreign body, angioedema, anaphylaxis, epiglottitis, retropharyngeal abscess.        Final Clinical Impression(s) / ED Diagnoses Final diagnoses:  Throat discomfort    Rx / DC Orders ED Discharge Orders     None         Tilden Fossa, MD 08/03/23 [phone]
